# Patient Record
Sex: Male | Born: 1965 | Race: White | Hispanic: No | Smoking: Current every day smoker
Health system: Southern US, Community
[De-identification: ages and names within clinical notes are randomized; demographics above are authoritative.]

## PROBLEM LIST (undated history)

## (undated) DIAGNOSIS — G629 Polyneuropathy, unspecified: Secondary | ICD-10-CM

## (undated) DIAGNOSIS — M545 Low back pain, unspecified: Secondary | ICD-10-CM

## (undated) DIAGNOSIS — M1990 Unspecified osteoarthritis, unspecified site: Secondary | ICD-10-CM

## (undated) DIAGNOSIS — G8929 Other chronic pain: Secondary | ICD-10-CM

## (undated) DIAGNOSIS — E559 Vitamin D deficiency, unspecified: Secondary | ICD-10-CM

## (undated) DIAGNOSIS — E781 Pure hyperglyceridemia: Secondary | ICD-10-CM

## (undated) DIAGNOSIS — F519 Sleep disorder not due to a substance or known physiological condition, unspecified: Secondary | ICD-10-CM

## (undated) HISTORY — DX: Other chronic pain: G89.29

## (undated) HISTORY — DX: Vitamin D deficiency, unspecified: E55.9

## (undated) HISTORY — PX: INGUINAL HERNIA REPAIR: SUR1180

## (undated) HISTORY — PX: TONSILLECTOMY: SUR1361

## (undated) HISTORY — DX: Polyneuropathy, unspecified: G62.9

## (undated) HISTORY — DX: Sleep disorder not due to a substance or known physiological condition, unspecified: F51.9

## (undated) HISTORY — DX: Unspecified osteoarthritis, unspecified site: M19.90

## (undated) HISTORY — DX: Pure hyperglyceridemia: E78.1

## (undated) HISTORY — DX: Low back pain, unspecified: M54.50

---

## 2020-07-22 ENCOUNTER — Encounter: Payer: Self-pay | Admitting: *Deleted

## 2020-07-27 ENCOUNTER — Ambulatory Visit: Payer: 59 | Admitting: Neurology

## 2020-07-27 ENCOUNTER — Encounter: Payer: Self-pay | Admitting: Neurology

## 2020-07-27 DIAGNOSIS — M5441 Lumbago with sciatica, right side: Secondary | ICD-10-CM | POA: Diagnosis not present

## 2020-07-27 DIAGNOSIS — G8929 Other chronic pain: Secondary | ICD-10-CM | POA: Diagnosis not present

## 2020-07-27 MED ORDER — FAMOTIDINE 20 MG PO TABS: 20.0000 mg | ORAL_TABLET | Freq: Two times a day (BID) | ORAL | Status: AC

## 2020-07-27 MED ORDER — GABAPENTIN 300 MG PO CAPS: 300.0000 mg | ORAL_CAPSULE | Freq: Two times a day (BID) | ORAL | 3 refills | Status: AC

## 2020-07-27 NOTE — Progress Notes (Signed)
Reason for visit: Chronic low back pain, right leg pain, foot numbness  Referring physician: Dr. Barb Merino is a 55 y.o. male  History of present illness:  Jerry Fisher is a 55 year old right-handed white male with a history of chronic low back pain and right leg pain.  The patient is a daily smoker.  He has been sent for arterial Dopplers in the legs, this has not yet been done.  The patient reports that he has had some troubles with his back since he was in his 30s, the back pain has gradually worsened over time.  He reports pain across the low back and into the right hip and down the right leg to the foot.  On both feet he has some numbness and tingling and some discomfort.  The discomfort in the feet is worse at night.  He has difficulty sleeping in part because of the pain.  He reports that his legs feel somewhat weak.  He has trouble standing up straight as he feels better when he is slightly stooped.  The patient does have some chronic neck pain as well and some sharp pains in the upper arm on the left with occasional spread into the left shoulder and neck, associated with some headaches.  The headaches may occur 4 or 5 times a week.  The patient denies any significant balance changes, he does report some chronic constipation issues and difficulty voiding the bladder.  He claims that he had an MRI of the lumbar spine done at [facility] 7 or 8 years ago, he does not know the results of this.  He has a prior history of alcohol abuse but he is no longer drinking.  He is sent to this office for evaluation of a possible peripheral neuropathy.  He was given a prescription for Lyrica, but he is no longer taking the medication.  Past Medical History:  Diagnosis Date  . Chronic low back pain   . Hypertriglyceridemia   . Osteoarthritis    left shoulder, L spine  . Peripheral neuropathy   . Psychophysiologic sleep disorder   . Vitamin D deficiency       Family History   Problem Relation Age of Onset  . Cancer Mother   . Cancer Sister   . Alcohol abuse Half-Brother     Social history:  reports that he has been smoking. He has been smoking about 1.00 pack per day. He has never used smokeless tobacco. He reports previous alcohol use. He reports previous drug use. Drug: Marijuana.  Medications:  Prior to Admission medications   Medication Sig Start Date End Date Taking? Authorizing Provider  celecoxib (CELEBREX) 200 MG capsule Take 200 mg by mouth daily.   Yes [provider]  diclofenac Sodium (VOLTAREN) 1 % GEL Apply topically 4 (four) times daily.   Yes [provider]  famotidine (PEPCID) 20 MG tablet Take 20 mg by mouth 2 (two) times daily.   Yes [provider]  ICOSAPENT ETHYL PO Take 1 g by mouth in the morning and at bedtime.   Yes [provider]  pregabalin (LYRICA) 75 MG capsule Take 75 mg by mouth 2 (two) times daily.   Yes [provider]  Vitamin D, Ergocalciferol, (DRISDOL) 1.25 MG (50000 UNIT) CAPS capsule Take 50,000 Units by mouth every 7 (seven) days.   Yes [provider]     No Known Allergies  ROS:  Out of a complete 14 system review of  symptoms, the patient complains only of the following symptoms, and all other reviewed systems are negative.  Back pain Right leg pain Tingling in the feet Neck pain  Blood pressure 122/77, pulse 68, height 5\' 2"  (1.575 m), weight 158 lb 6.4 oz (71.8 kg).  Physical Exam  General: The patient is alert and cooperative at the time of the examination.  Eyes: Pupils are equal, round, and reactive to light. Discs are flat bilaterally.  Neck: The neck is supple, no carotid bruits are noted.  Respiratory: The respiratory examination is clear.  Cardiovascular: The cardiovascular examination reveals a regular rate and rhythm, no obvious murmurs or rubs are noted.  Neuromuscular: The patient is able to flex to about 90 degrees of the low back.   The patient has a slightly stooped posture, the right shoulder is depressed compared to the left.  Skin: Extremities are without significant edema.  Neurologic Exam  Mental status: The patient is alert and oriented x 3 at the time of the examination. The patient has apparent normal recent and remote memory, with an apparently normal attention span and concentration ability.  Cranial nerves: Facial symmetry is present. There is good sensation of the face to pinprick and soft touch bilaterally. The strength of the facial muscles and the muscles to head turning and shoulder shrug are normal bilaterally. Speech is well enunciated, no aphasia or dysarthria is noted. Extraocular movements are full. Visual fields are full. The tongue is midline, and the patient has symmetric elevation of the soft palate. No obvious hearing deficits are noted.  Motor: The motor testing reveals 5 over 5 strength of all 4 extremities. Good symmetric motor tone is noted throughout.  Sensory: Sensory testing is intact to pinprick, soft touch, vibration sensation, and position sense on all 4 extremities.  No clear stocking pattern pinprick sensory deficit was noted in the lower extremities.  No evidence of extinction is noted.  Coordination: Cerebellar testing reveals good finger-nose-finger and heel-to-shin bilaterally.  Gait and station: Gait is normal. Tandem gait is normal. Romberg is negative. No drift is seen.  The patient is able to walk on heels and the toes bilaterally.  Reflexes: Deep tendon reflexes are symmetric and normal bilaterally.  The ankle jerk reflexes are well-maintained bilaterally.  Toes are downgoing bilaterally.   Assessment/Plan:  1.  Chronic low back pain, right-sided sciatica  2.  Paresthesias, both feet  The patient does not have clear evidence of a peripheral neuropathy on clinical examination today.  His symptoms in the legs may be related to a lumbar spine issue.  The patient will be  set up for nerve conduction studies on both legs and EMG on the right leg.  We may consider MRI of the lumbar spine in the future.  He will be given a prescription for gabapentin to take 300 mg twice daily, he will call for any dose adjustments.  He will otherwise follow-up in 4 months.  MD 07/27/2020 8:48 AM  [facility] [address] [place], [place]  Phone [phone] Fax [phone]

## 2020-07-27 NOTE — Patient Instructions (Signed)
We will start gabapentin for the low back pain.  Neurontin (gabapentin) may result in drowsiness, ankle swelling, gait instability, or possibly dizziness. Please contact our office if significant side effects occur with this medication.  

## 2020-08-10 ENCOUNTER — Encounter: Payer: 59 | Admitting: Neurology

## 2020-08-24 ENCOUNTER — Encounter: Payer: Self-pay | Admitting: Neurology

## 2020-08-24 ENCOUNTER — Ambulatory Visit (INDEPENDENT_AMBULATORY_CARE_PROVIDER_SITE_OTHER): Payer: 59 | Admitting: Neurology

## 2020-08-24 ENCOUNTER — Ambulatory Visit: Payer: 59 | Admitting: Neurology

## 2020-08-24 DIAGNOSIS — M5441 Lumbago with sciatica, right side: Secondary | ICD-10-CM | POA: Diagnosis not present

## 2020-08-24 DIAGNOSIS — G8929 Other chronic pain: Secondary | ICD-10-CM

## 2020-08-24 NOTE — Progress Notes (Addendum)
The patient comes in for EMG and nerve conduction study evaluation.  Nerve conductions were normal, EMG of the right leg was normal.  The source of the leg discomfort, right greater than left is not delineated.  We will check MRI of the lumbar spine     MNC    Nerve / Sites Muscle Latency Ref. Amplitude Ref. Rel Amp Segments Distance Velocity Ref. Area    ms ms mV mV %  cm m/s m/s mVms  L Peroneal - EDB     Ankle EDB 3.3 ?6.5 7.3 ?2.0 100 Ankle - EDB 9   19.1     Fib head EDB 8.8  6.4  86.9 Fib head - Ankle 27 49 ?44 17.3     Pop fossa EDB 11.0  6.3  98.6 Pop fossa - Fib head 10 44 ?44 17.5         Pop fossa - Ankle      R Peroneal - EDB     Ankle EDB 3.8 ?6.5 7.5 ?2.0 100 Ankle - EDB 9   18.0     Fib head EDB 9.4  7.0  92.9 Fib head - Ankle 27 48 ?44 17.5     Pop fossa EDB 11.5  6.6  94.1 Pop fossa - Fib head 10 48 ?44 17.0         Pop fossa - Ankle      L Tibial - AH     Ankle AH 3.2 ?5.8 4.3 ?4.0 100 Ankle - AH 9   8.6     Pop fossa AH 11.9  3.6  82.5 Pop fossa - Ankle 38 43 ?41 7.5  R Tibial - AH     Ankle AH 2.9 ?5.8 5.3 ?4.0 100 Ankle - AH 9   7.0     Pop fossa AH 11.0  4.1  77.9 Pop fossa - Ankle 37 46 ?41 6.8             SNC    Nerve / Sites Rec. Site Peak Lat Ref.  Amp Ref. Segments Distance    ms ms V V  cm  L Sural - Ankle (Calf)     Calf Ankle 2.9 ?4.4 9 ?6 Calf - Ankle 14  R Sural - Ankle (Calf)     Calf Ankle 2.6 ?4.4 8 ?6 Calf - Ankle 14  L Superficial peroneal - Ankle     Lat leg Ankle 3.3 ?4.4 6 ?6 Lat leg - Ankle 14  R Superficial peroneal - Ankle     Lat leg Ankle 3.4 ?4.4 5 ?6 Lat leg - Ankle 14             F  Wave    Nerve F Lat Ref.   ms ms  L Tibial - AH 49.5 ?56.0  R Tibial - AH 48.8 ?56.0

## 2020-08-24 NOTE — Progress Notes (Signed)
Please refer to EMG and nerve conduction procedure note.  

## 2020-08-24 NOTE — Procedures (Signed)
     HISTORY:  Jerry Fisher is a 55 year old gentleman with a chronic history of low back pain with right greater than left lower extremity discomfort and numbness in both feet.  The patient is having discomfort that is worsening over time, he is not sleeping well.  He is in physical therapy without much benefit.  He is being evaluated for possible radiculopathy.  NERVE CONDUCTION STUDIES:  Nerve conduction studies were performed on both lower extremities. The distal motor latencies and motor amplitudes for the peroneal and posterior tibial nerves were within normal limits. The nerve conduction velocities for these nerves were also normal. The sensory latencies for the peroneal and sural nerves were within normal limits. The F wave latencies for the posterior tibial nerves were within normal limits.   EMG STUDIES:  EMG study was performed on the right lower extremity:  The tibialis anterior muscle reveals 2 to 4K motor units with full recruitment. No fibrillations or positive waves were seen. The peroneus tertius muscle reveals 2 to 4K motor units with full recruitment. No fibrillations or positive waves were seen. The medial gastrocnemius muscle reveals 1 to 3K motor units with full recruitment. No fibrillations or positive waves were seen. The vastus lateralis muscle reveals 2 to 4K motor units with full recruitment. No fibrillations or positive waves were seen. The iliopsoas muscle reveals 2 to 4K motor units with full recruitment. No fibrillations or positive waves were seen. The biceps femoris muscle (long head) reveals 2 to 4K motor units with full recruitment. No fibrillations or positive waves were seen. The lumbosacral paraspinal muscles were tested at 3 levels, and revealed no abnormalities of insertional activity at all 3 levels tested. There was fair relaxation.   IMPRESSION:  Nerve conduction studies done on both lower extremities were within normal limits, no evidence of  peripheral neuropathy was seen.  EMG evaluation of the right lower extremity was unremarkable without evidence of an overlying lumbar radiculopathy.  Marlan Palau MD 08/24/2020 3:42 PM  [facility] [address] [place], [place] [address]  Phone [phone] Fax [phone]

## 2020-08-25 ENCOUNTER — Telehealth: Payer: Self-pay | Admitting: Neurology

## 2020-08-25 NOTE — Telephone Encounter (Signed)
[employer] order sent to [facility]. They will obtain the auth and reach out to the patient to schedule.  

## 2020-08-28 ENCOUNTER — Other Ambulatory Visit: Payer: Self-pay | Admitting: Neurology

## 2020-08-28 ENCOUNTER — Ambulatory Visit
Admission: RE | Admit: 2020-08-28 | Discharge: 2020-08-28 | Disposition: A | Discharge status: Home / Self Care | Payer: 59 | Source: Ambulatory Visit | Attending: Neurology | Admitting: Neurology

## 2020-08-28 ENCOUNTER — Other Ambulatory Visit: Payer: Self-pay

## 2020-08-28 DIAGNOSIS — Z77018 Contact with and (suspected) exposure to other hazardous metals: Secondary | ICD-10-CM

## 2020-08-28 DIAGNOSIS — G8929 Other chronic pain: Secondary | ICD-10-CM

## 2020-08-28 DIAGNOSIS — M5441 Lumbago with sciatica, right side: Secondary | ICD-10-CM

## 2020-08-28 IMAGING — MR MR LUMBAR SPINE W/O CM
4 of 5 series · 26 of 48 positions shown · non-contrast
Comparison: CT abdomen/pelvis from [DATE].

CLINICAL DATA: Lumbar radiculopathy.  Right leg weakness.

EXAM:
MRI LUMBAR SPINE WITHOUT CONTRAST
TECHNIQUE: Multiplanar, multisequence MR imaging of the lumbar spine was
performed. No intravenous contrast was administered.

[Series 3: T2 · sagittal · 4.0mm · 1.09mm/px · 6 of 20 slices shown (1 of 2)]
[im 1/20]
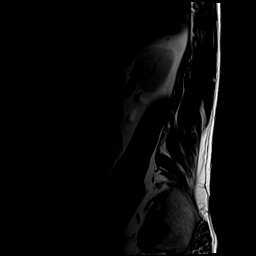
[im 4/20]
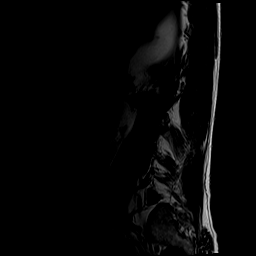
[im 8/20]
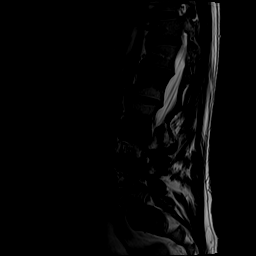
[im 12/20]
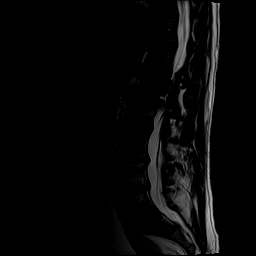
[im 16/20]
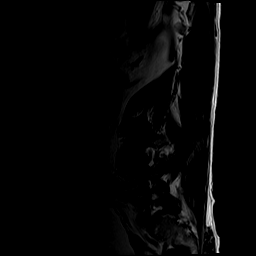
[im 20/20]
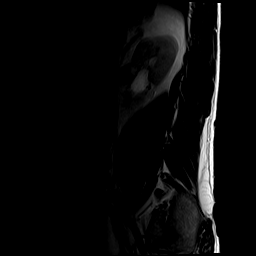

[Series 5: T1 · sagittal · 4.0mm · 1.09mm/px · 6 of 20 slices shown (1 of 2)]
[im 1/20]
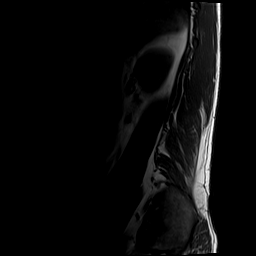
[im 4/20]
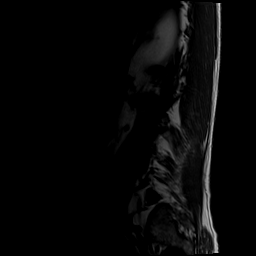
[im 8/20]
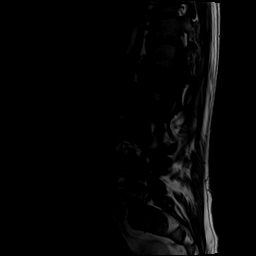
[im 12/20]
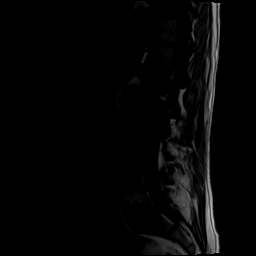
[im 16/20]
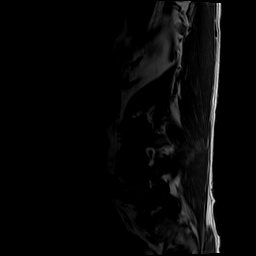
[im 20/20]
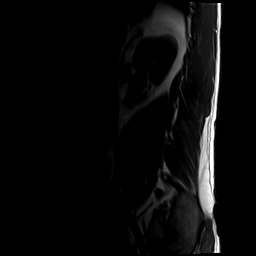

[Series 6: T2 · axial · 4.0mm · 0.39mm/px · z∈[-115,+142]mm · 9 of 52 slices shown (2 of 2)]
[im 1/52]
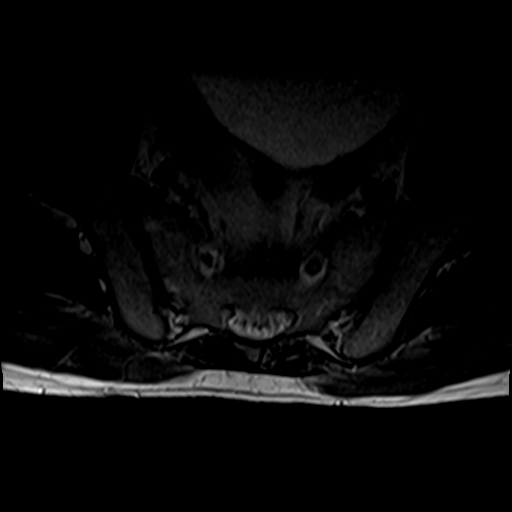
[im 8/52]
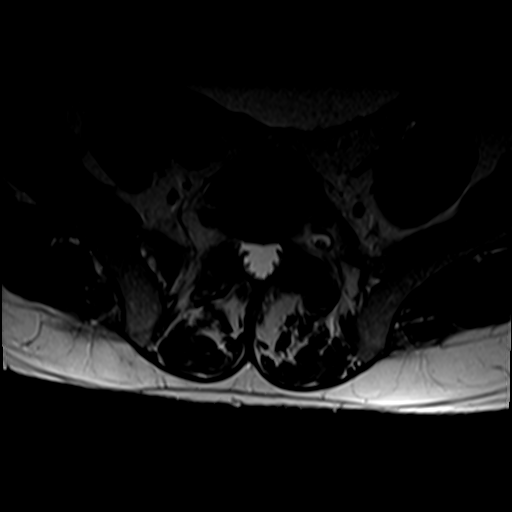
[im 15/52]
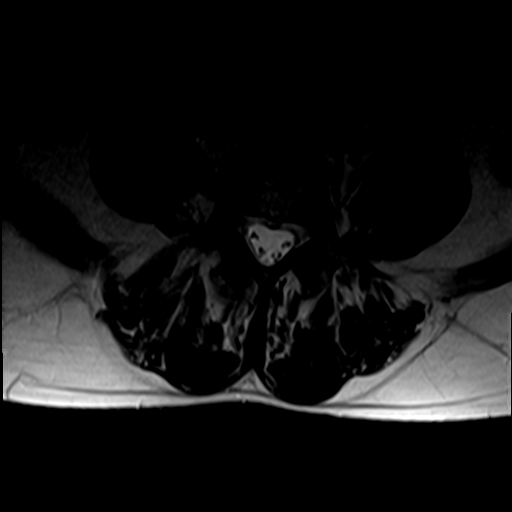
[im 22/52]
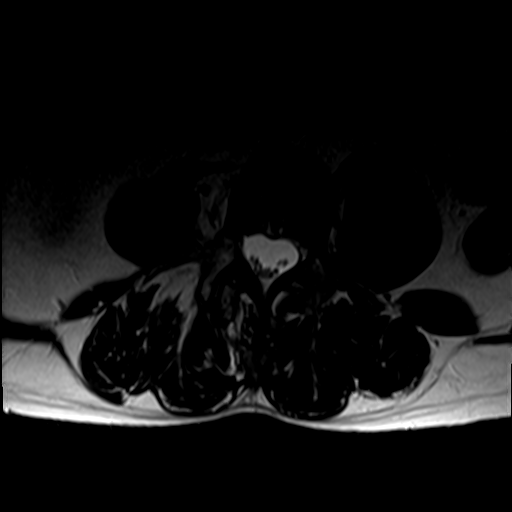
[im 26/52]
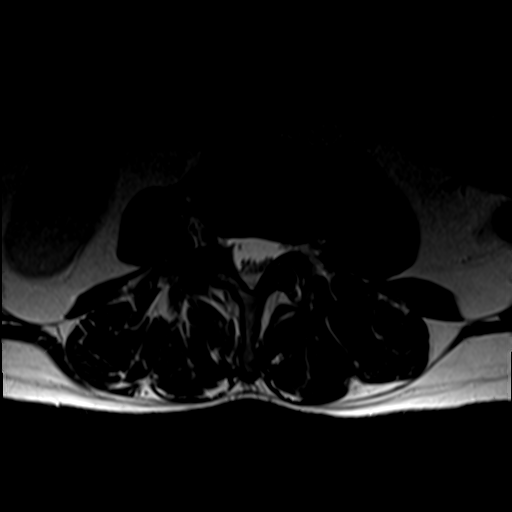
[im 30/52]
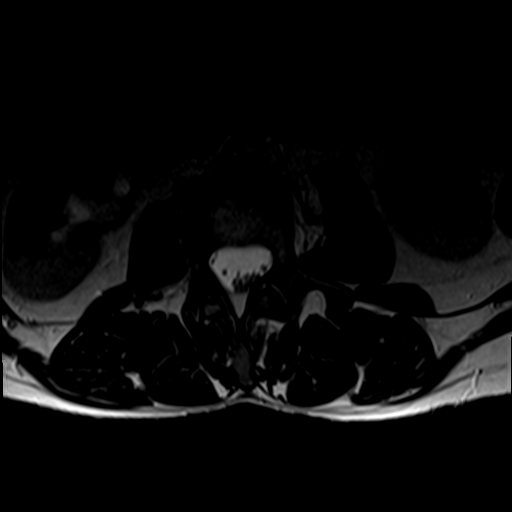
[im 37/52]
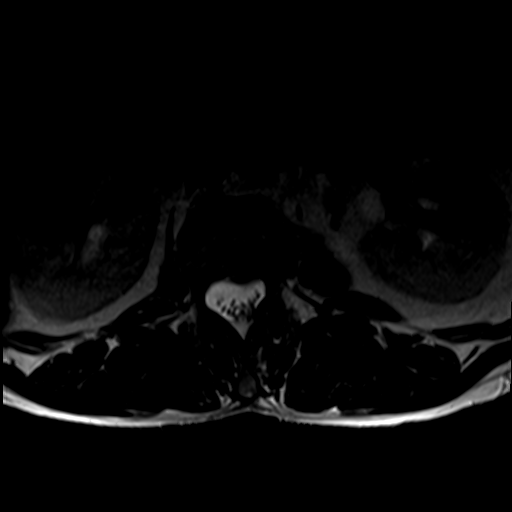
[im 44/52]
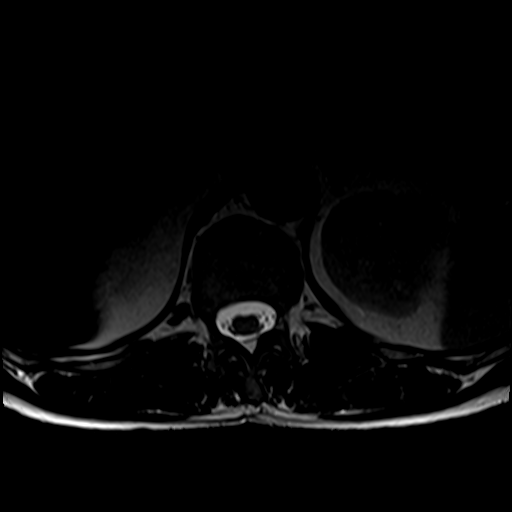
[im 52/52]
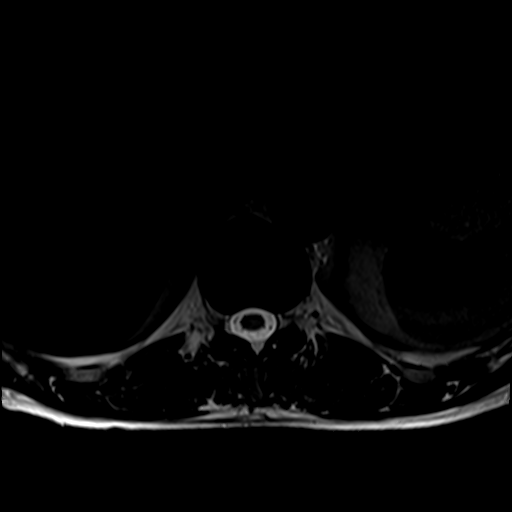

[Series 7: T1 · axial · 4.0mm · 0.39mm/px · z∈[-115,+104]mm · 5 of 52 slices shown (2 of 2)]
[im 1/52]
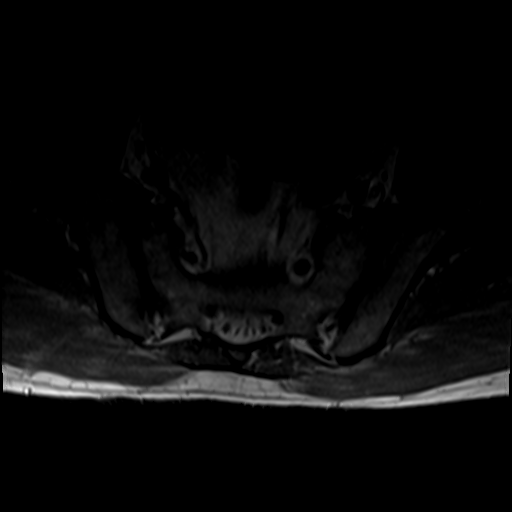
[im 8/52]
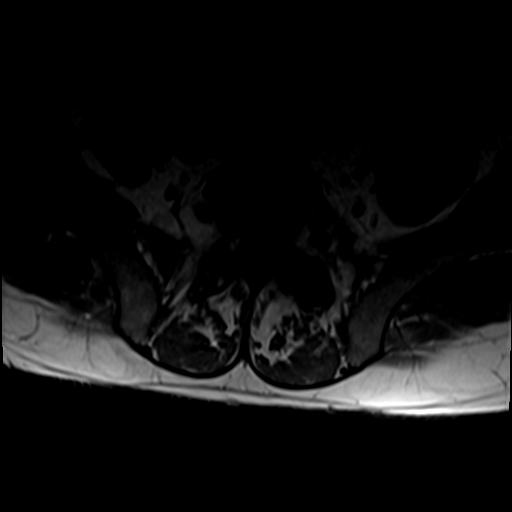
[im 15/52]
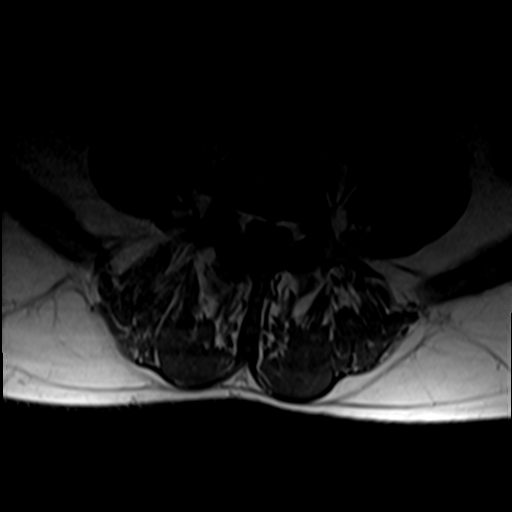
[im 26/52]
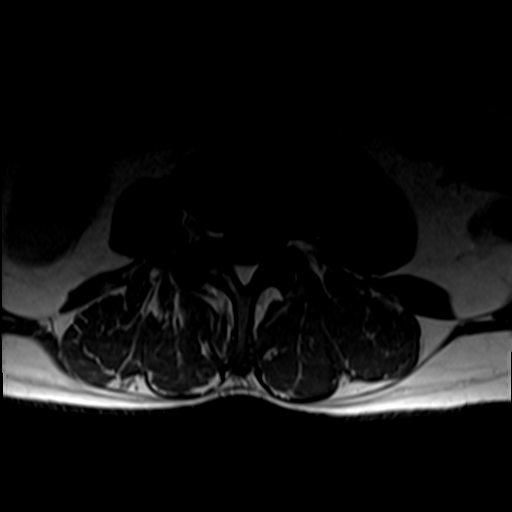
[im 44/52]
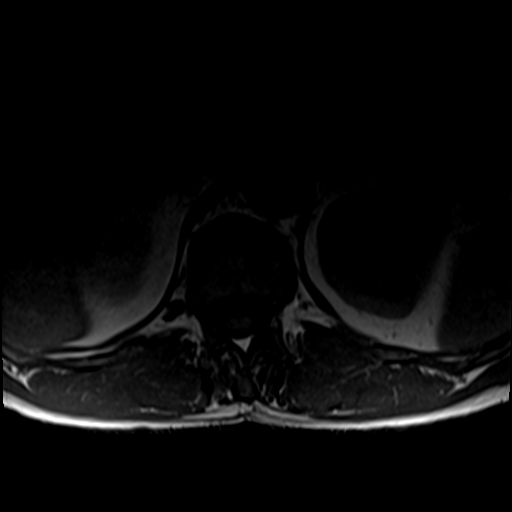

[26 of 48 positions shown; findings below may reference images not displayed]

FINDINGS: Segmentation: Standard segmentation is imaged. The inferior-most
fully formed intervertebral disc is labeled L5-S1.

Alignment: Dextrocurvature of the upper spine with levocurvature of
the lower spine.

Vertebrae: Degenerative/discogenic signal changes about the L1-L2,
L3-L4 and L4-L5 discs. No specific evidence of acute fracture or
discitis/osteomyelitis.

Conus medullaris and cauda equina: Conus extends to the T12-L1
level. Conus appears normal.

Paraspinal and other soft tissues: Partially imaged distended
bladder with somewhat trabeculated walls.

Disc levels:

T11-T12: Small right subarticular disc protrusion without
significant canal or foraminal stenosis.

T12-L1: Small broad disc bulge with left subarticular disc
protrusion. No significant canal or foraminal stenosis.

L1-L2: Left eccentric disc desiccation and height loss. Left
eccentric disc bulging. Mild left foraminal stenosis. There is left
far lateral/extraforaminal disc.

L2-L3: Disc bulging and right greater than left facet hypertrophy.
Resulting moderate right foraminal stenosis. No significant left
foraminal or canal stenosis.

L3-L4: Right eccentric disc desiccation and height loss. Bulky
right-sided facet hypertrophy. Resulting moderate to severe right
foraminal stenosis. No significant canal or left foraminal stenosis.
Moderate to severe right subarticular recess stenosis with suspected
impingement of the descending right L4 nerve roots.

L4-L5: Disc desiccation height loss. Broad disc bulge and bilateral
facet hypertrophy. No significant canal stenosis. Moderate bilateral
foraminal stenosis. Mild left subarticular recess stenosis.

L5-S1: Mild disc bulging and bilateral facet hypertrophy. No
significant canal or foraminal stenosis.
IMPRESSION: 1. Moderate to severe right subarticular recess stenosis at L3-L4
with suspected impingement of the descending right L4 nerve roots.
Moderate to severe right foraminal stenosis at this level.
2. Moderate foraminal stenosis on the right at L3-L4 and bilaterally
at L4-L5. Mild left foraminal stenosis at L1-L2.
3. Partially imaged distended bladder with somewhat trabeculated
walls, which can be seen with bladder outlet obstruction.
4. Dextrocurvature of the upper lumbar spine and levocurvature of
the lower lumbar spine.

## 2020-08-28 IMAGING — CR DG ORBITS FOR FOREIGN BODY
2 series · 2 of 2 positions shown · non-contrast
Comparison: None.

CLINICAL DATA: Metal working/exposure; clearance prior to MRI

EXAM:
ORBITS FOR FOREIGN BODY - 2 VIEW

[w orbit pa (1 of 2)]
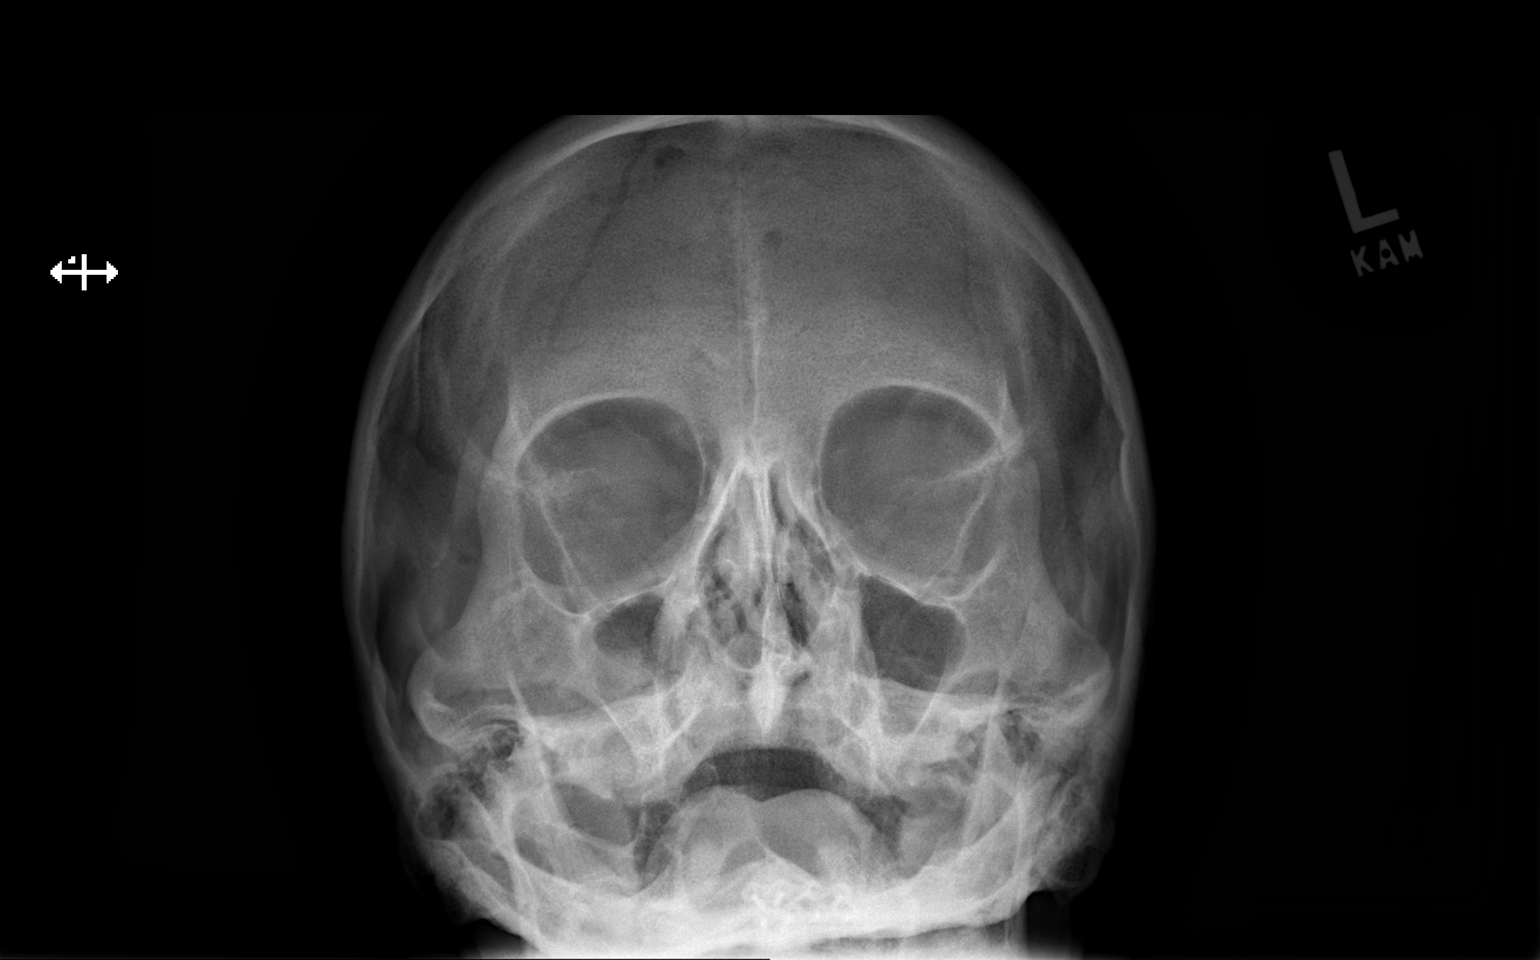

[w orbit pa (2 of 2)]
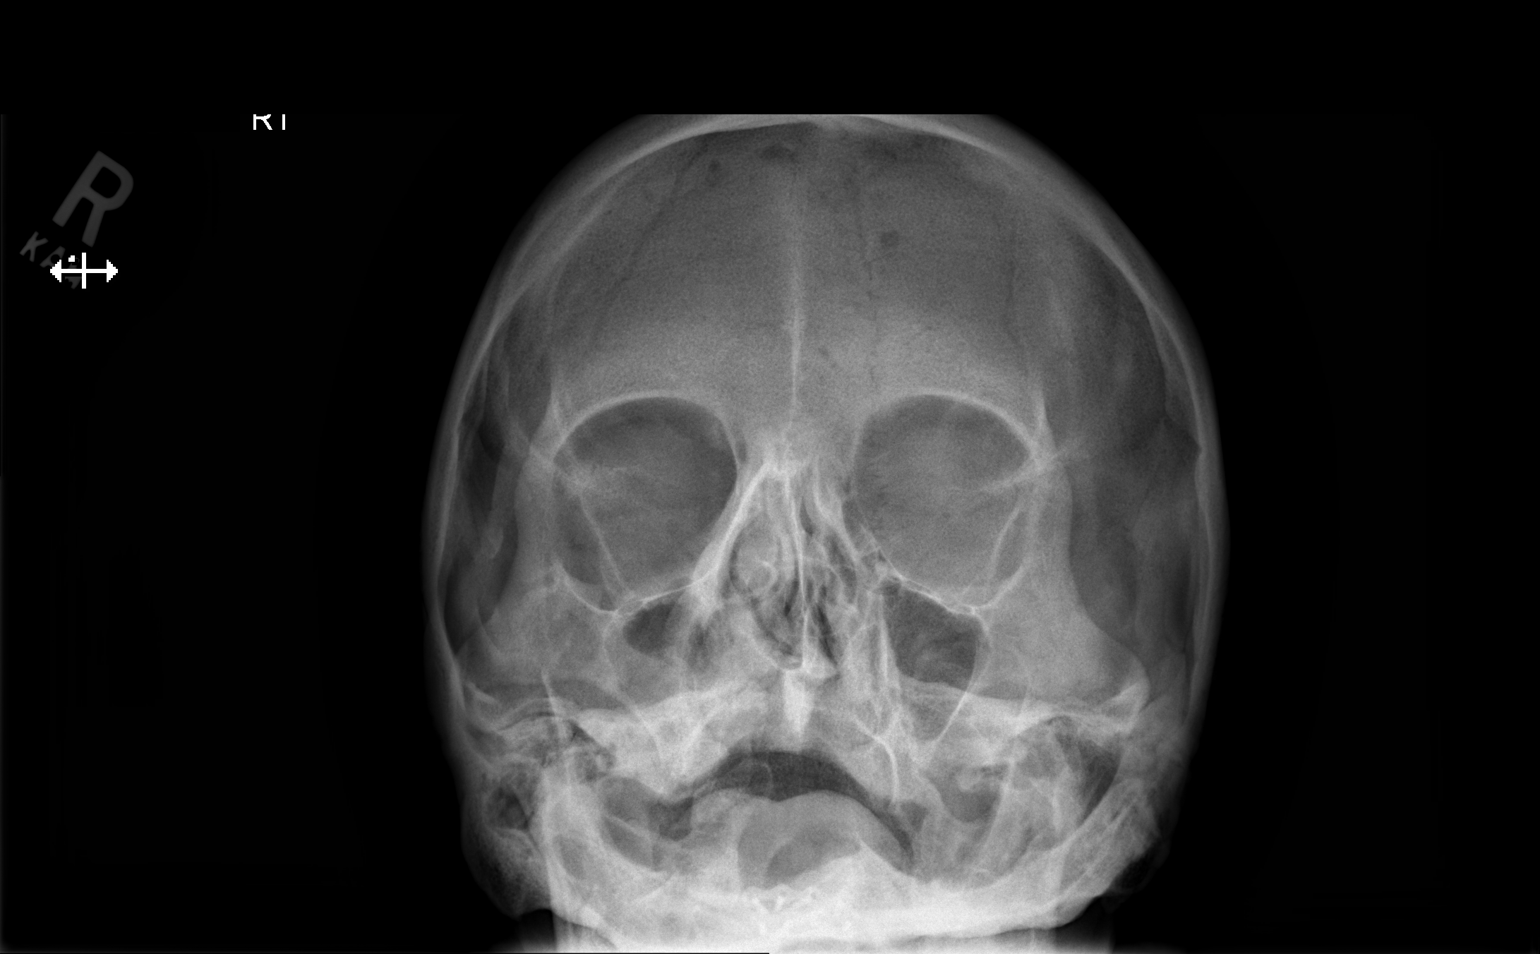

[2 of 2 positions shown; findings below may reference images not displayed]

FINDINGS: There is no evidence of metallic foreign body within the orbits. No
significant bone abnormality identified.
IMPRESSION: No evidence of metallic foreign body within the orbits.

## 2020-08-30 ENCOUNTER — Telehealth: Payer: Self-pay | Admitting: Neurology

## 2020-08-30 DIAGNOSIS — M5431 Sciatica, right side: Secondary | ICD-10-CM

## 2020-08-30 NOTE — Telephone Encounter (Signed)
I called the patient.  MRI of the low back does show potential for right L5 nerve root impingement.  This would correlate with his sciatica down the right leg.  I will try an epidural steroid injection, if this does not help, we may consider a neurosurgery referral.     MRI lumbar 08/28/20:  IMPRESSION: This MRI of the lumbar spine without contrast shows the following: 1.   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 2.   Scoliosis that is convex to the right in the upper lumbar spine and convex to the left in the lower lumbar spine. 3.   At L2-L3, there is severe loss of disc height, more to the left and other degenerative changes more to the left causing moderate left foraminal narrowing and moderate left lateral recess stenosis.  There does not appear to be nerve root compression. 4.   At L4-L5, there is disc bulging, facet hypertrophy, endplate spurring and endplate edema.  These cause moderately severe right foraminal narrowing and moderate right lateral recess stenosis.  There is potential for right L4 nerve root compression. 5.   At L5-L6(S1), there is severe loss of disc height and other degenerative changes causing mild to moderate bilateral foraminal narrowing and moderate left lateral recess stenosis and mild to moderate right lateral recess stenosis.  There does not appear to be nerve root compression. 6.   There are milder degenerative changes at the other lumbosacral levels as detailed above that does not lead to significant foraminal narrowing, lateral recess stenosis or spinal stenosis.

## 2020-08-31 NOTE — Telephone Encounter (Signed)
Pt called wanting to know if he is still needing to go to PT. Please advise.

## 2020-09-01 ENCOUNTER — Telehealth: Payer: Self-pay | Admitting: Neurology

## 2020-09-01 ENCOUNTER — Other Ambulatory Visit: Payer: Self-pay | Admitting: Neurology

## 2020-09-01 DIAGNOSIS — N320 Bladder-neck obstruction: Secondary | ICD-10-CM

## 2020-09-01 DIAGNOSIS — M5431 Sciatica, right side: Secondary | ICD-10-CM

## 2020-09-01 NOTE — Telephone Encounter (Signed)
I called the patient.  MRI of the low back shows potential for impingement of the right L4 nerve root.  Would recommend epidural steroid injection to see if this benefits the right-sided leg pain.  Also, the patient has reported difficulty voiding the bladder, there is evidence of possible bladder obstruction, if you would like a urology referral he is to let me know.   MRI lumbar 09/01/20:  IMPRESSION: 1. 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 Moderate to severe right foraminal stenosis at this level. 2. Moderate foraminal stenosis on the right at L3-L4 and bilaterally at L4-L5. Mild left foraminal stenosis at L1-L2. 3. Partially imaged distended bladder with somewhat trabeculated walls, which can be seen with bladder outlet obstruction. 4. Dextrocurvature of the upper lumbar spine and levocurvature of the lower lumbar spine.

## 2020-09-01 NOTE — Telephone Encounter (Signed)
Received this note from Dr. Anne Hahn :"Please call the patient tomorrow and let him know that if he has been set up for physical therapy, he should continue with this, the epidural may help as well with improving symptoms.."  I called patient.  I discussed this with him.  He has a physical therapy appointment set up for Friday.  He understands that radiology will be calling him to set up the epidural steroid injection.  Patient verbalized understanding and appreciation.

## 2020-09-01 NOTE — Progress Notes (Signed)
The patient called back, he is amenable to having a urology referral, I will get this set up.  He wishes to go ahead and get the epidural steroid injection, possibility of a right L4 nerve root impingement.  I have placed the order for this.

## 2020-09-02 ENCOUNTER — Other Ambulatory Visit: Payer: Self-pay | Admitting: Chiropractic Medicine

## 2020-09-02 ENCOUNTER — Telehealth: Payer: Self-pay | Admitting: Neurology

## 2020-09-02 ENCOUNTER — Other Ambulatory Visit: Payer: Self-pay | Admitting: Neurology

## 2020-09-02 DIAGNOSIS — M5416 Radiculopathy, lumbar region: Secondary | ICD-10-CM

## 2020-09-02 NOTE — Telephone Encounter (Addendum)
Faxed Urology referral to [facility]. Requested first available. Phone [phone], Fax [phone].   Faxed Interventional Radiology referral to [facility]. Fax: [phone].

## 2020-09-16 ENCOUNTER — Telehealth: Payer: Self-pay | Admitting: Neurology

## 2020-09-16 NOTE — Telephone Encounter (Signed)
Called patient and he said if his insurance denies the procedure he wants to be referred to a pain management center.  Patient is calling to find out if he will be able to get the procedure and is going to call back.  Patient denied further questions, verbalized understanding and expressed appreciation for the phone call.

## 2020-09-16 NOTE — Telephone Encounter (Signed)
Pt called needing to speak to the Provider or RN regarding the epidural that he is to get for his back. Pt would like to know if this will be covered by his insurance. He states that if it isnt he is needing some other kind of option because he is in a lot of pain. Please advise.

## 2020-09-16 NOTE — Telephone Encounter (Signed)
Patient called back, stated that he will have to have a peer to peer with his insurance company for him to be approved for the epidural injection.    Patient is also asking for referral to pain management.    He is asking for Dr. Anne Hahn to call him.

## 2020-09-17 NOTE — Telephone Encounter (Signed)
LVM for cathy @ [facility] regarding PreAuth for injection.  Will need denial information to receive the Peer to Peer information for appeal.

## 2020-09-21 NOTE — Telephone Encounter (Signed)
[place] returned call from [facility].  Said she would talk to the Contractor and get the denial letter so a peer to peer can be done.

## 2020-09-21 NOTE — Telephone Encounter (Signed)
Pt called wanting to speak to the Provider regarding the Epidural. Pt states that it was questioned if he had taken PT and pt states he has already had PT. Please advise.

## 2020-09-21 NOTE — Telephone Encounter (Signed)
Pt returned call and was informed. Pt was very appreciative.

## 2020-09-21 NOTE — Telephone Encounter (Signed)
LVM for Jerry Fisher at [facility] to return call

## 2020-09-21 NOTE — Telephone Encounter (Signed)
LVM for patient to return call.    If patient calls back, please let him know that Dr. Anne Hahn will do a peer to peer.  We have to get the denial letter back with information for him.  Thanks

## 2020-09-24 NOTE — Telephone Encounter (Signed)
Denial information letter received from [facility].  Given to Dr Anne Hahn for peer to peer review.

## 2020-09-25 ENCOUNTER — Encounter: Payer: Self-pay | Admitting: Neurology

## 2020-09-25 ENCOUNTER — Telehealth: Payer: Self-pay | Admitting: Neurology

## 2020-09-25 NOTE — Telephone Encounter (Signed)
I called the patient.  The epidural injection was denied, the time for initiation of peer to peer review has lapsed, I will have to make a formal appeal.  The insurance indicates that the patient will require physical therapy before the epidural injection is allowed, but he has already had physical therapy, he was getting physical therapy when he was initially seen in March 2022.  MRI of the lumbar spine has shown evidence of impingement of the right L4 nerve root, the patient is having sciatica pain down the right leg, MRI evidence and clinical evidence support 1 another.   I will dictate a letter of appeal in this regard.

## 2020-10-06 NOTE — Telephone Encounter (Signed)
Appeal faxed for epidural injection Case # [id number] To [employer]  OK transmission received

## 2020-10-08 ENCOUNTER — Telehealth: Payer: Self-pay | Admitting: Neurology

## 2020-10-08 NOTE — Telephone Encounter (Signed)
Pt called, following up on the peer to peer with insurance company.  Would like a call from the nurse.

## 2020-10-08 NOTE — Telephone Encounter (Signed)
Returned and LVM for patient to return call.      Today:  Received call from patient following up on peer to peer    Per Dr. Anne Hahn 09/25/20 I called the patient.  The epidural injection was denied, the time for initiation of peer to peer review has lapsed, I will have to make a formal appeal.   The insurance indicates that the patient will require physical therapy before the epidural injection is allowed, but he has already had physical therapy, he was getting physical therapy when he was initially seen in March 2022.   MRI of the lumbar spine has shown evidence of impingement of the right L4 nerve root, the patient is having sciatica pain down the right leg, MRI evidence and clinical evidence support 1 another.     I will dictate a letter of appeal in this regard.

## 2020-10-12 NOTE — Telephone Encounter (Signed)
Patient returned call.  Went over Dr  Anne Hahn documentation regarding peer to peer review as well as the formal review that has been initiated by Dr. Anne Hahn.  Patient will be updated as soon as we learn more.  Patient denied further questions, verbalized understanding and expressed appreciation for the phone call.

## 2020-10-12 NOTE — Telephone Encounter (Signed)
Patient sent message for follow up on epidural injection. LVM for patient to return call.

## 2021-01-07 ENCOUNTER — Other Ambulatory Visit: Payer: Self-pay | Admitting: Emergency Medicine

## 2021-01-07 DIAGNOSIS — R519 Headache, unspecified: Secondary | ICD-10-CM

## 2021-01-11 ENCOUNTER — Other Ambulatory Visit: Payer: Self-pay | Admitting: Emergency Medicine

## 2021-01-11 ENCOUNTER — Other Ambulatory Visit: Payer: 59

## 2021-01-11 ENCOUNTER — Ambulatory Visit
Admission: RE | Admit: 2021-01-11 | Discharge: 2021-01-11 | Disposition: A | Discharge status: Home / Self Care | Payer: 59 | Source: Ambulatory Visit | Attending: Emergency Medicine | Admitting: Emergency Medicine

## 2021-01-11 ENCOUNTER — Other Ambulatory Visit: Payer: Self-pay

## 2021-01-11 ENCOUNTER — Ambulatory Visit
Admission: RE | Admit: 2021-01-11 | Discharge: 2021-01-11 | Disposition: A | Discharge status: Home / Self Care | Payer: Worker's Compensation | Source: Ambulatory Visit | Attending: Emergency Medicine | Admitting: Emergency Medicine

## 2021-01-11 DIAGNOSIS — R519 Headache, unspecified: Secondary | ICD-10-CM

## 2021-01-11 IMAGING — MR MR CERVICAL SPINE W/O CM
4 of 5 series · 27 of 48 positions shown · non-contrast
Comparison: None.

CLINICAL DATA: Neck pain and headaches

EXAM:
MRI CERVICAL SPINE WITHOUT CONTRAST
TECHNIQUE: Multiplanar, multisequence MR imaging of the cervical spine was
performed. No intravenous contrast was administered.

[Series 5: T2 · sagittal · 3.0mm · 0.55mm/px · 6 of 15 slices shown (1 of 2)]
[im 1/15]
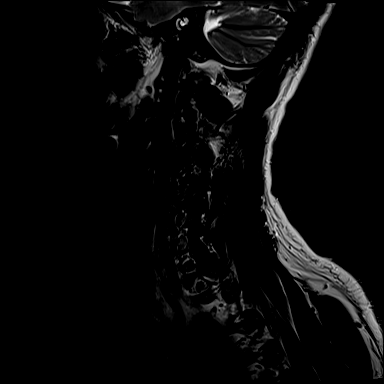
[im 3/15]
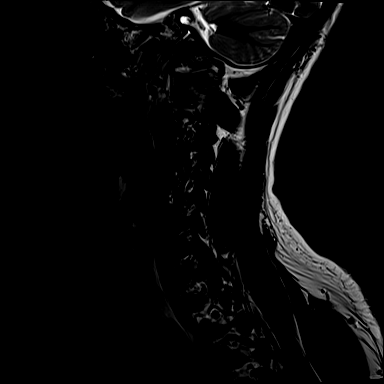
[im 6/15]
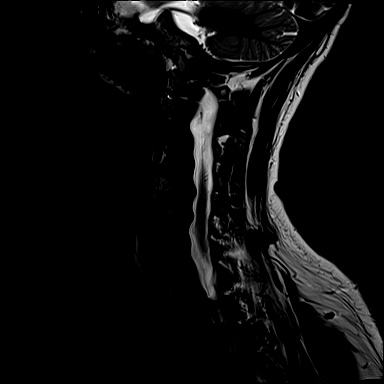
[im 9/15]
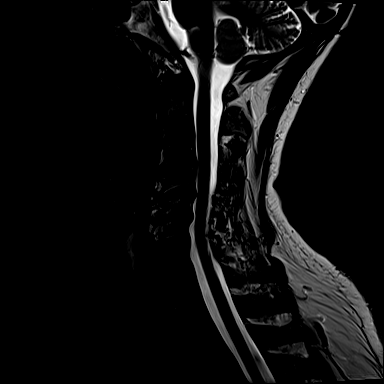
[im 12/15]
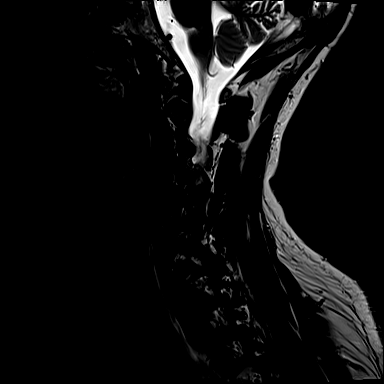
[im 15/15]
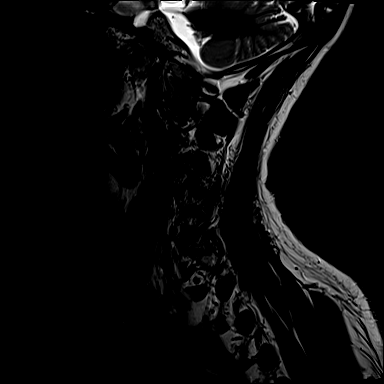

[Series 6: T1 · sagittal · 3.0mm · 0.66mm/px · 7 of 15 slices shown]
[im 1/15]
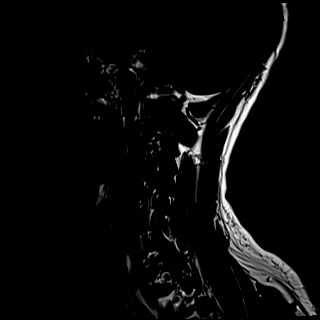
[im 3/15]
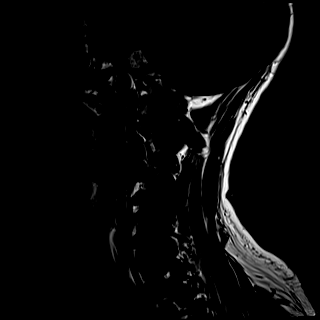
[im 5/15]
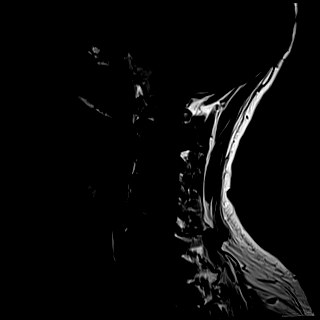
[im 8/15]
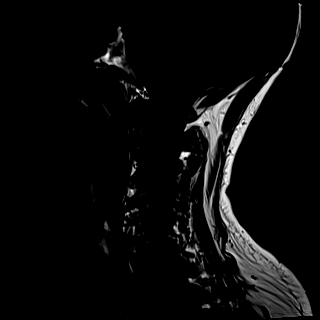
[im 10/15]
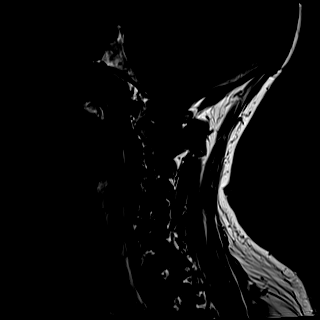
[im 12/15]
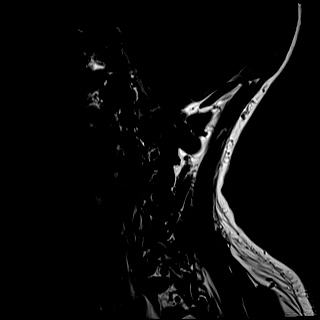
[im 15/15]
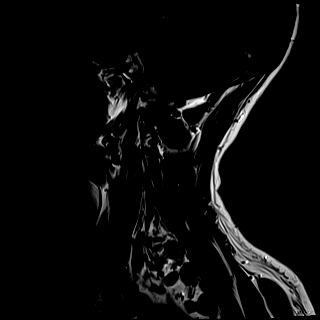

[Series 7: STIR · sagittal · 3.0mm · 0.33mm/px · 6 of 15 slices shown]
[im 1/15]
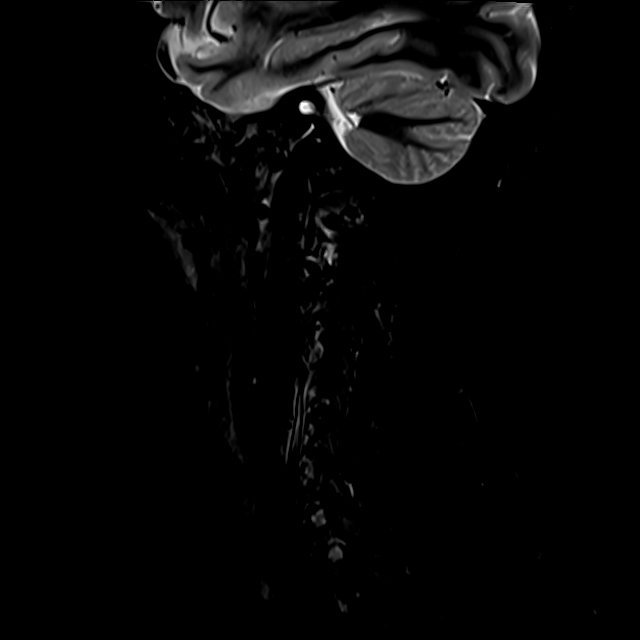
[im 3/15]
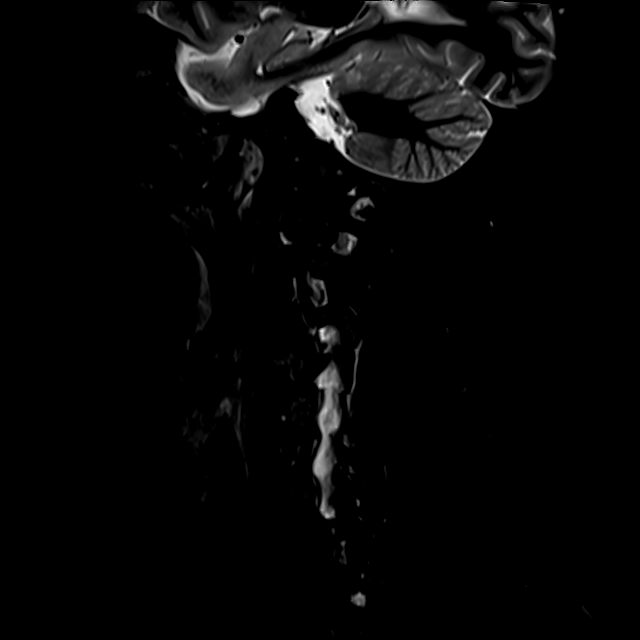
[im 5/15]
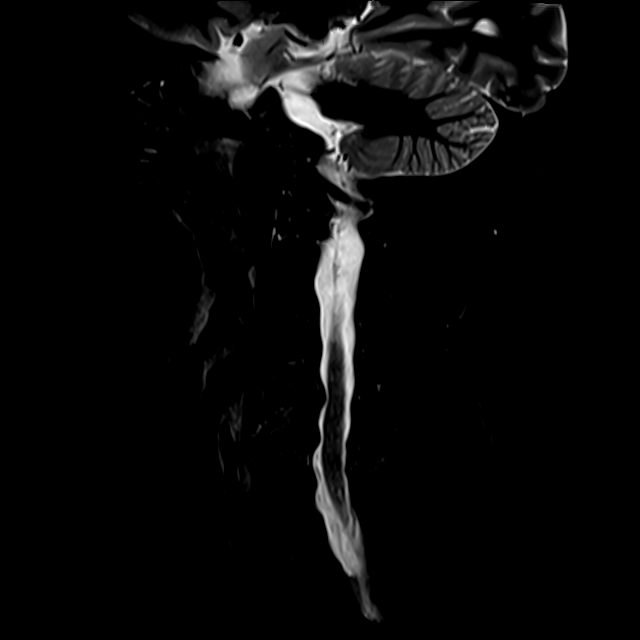
[im 8/15]
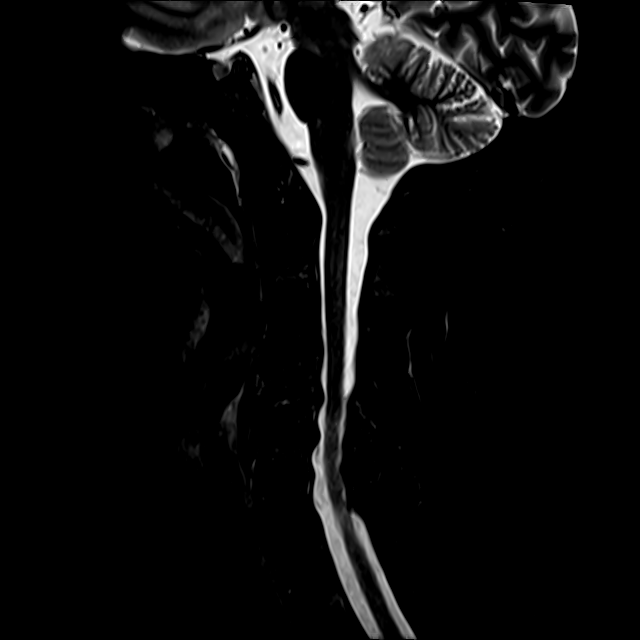
[im 10/15]
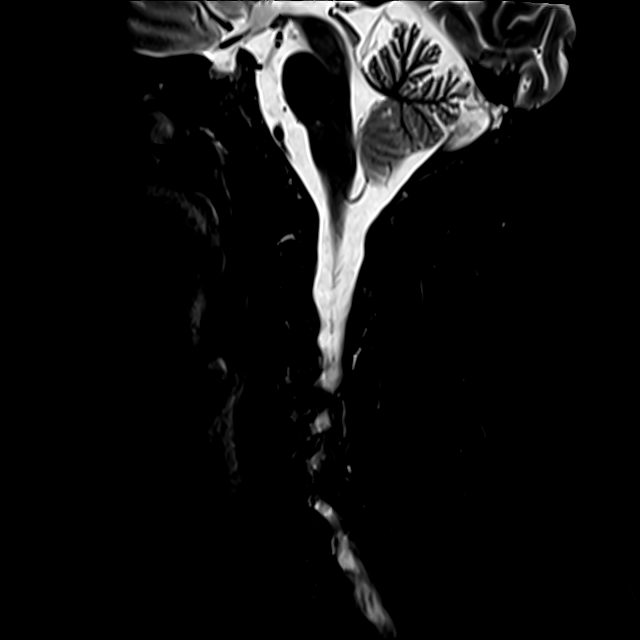
[im 12/15]
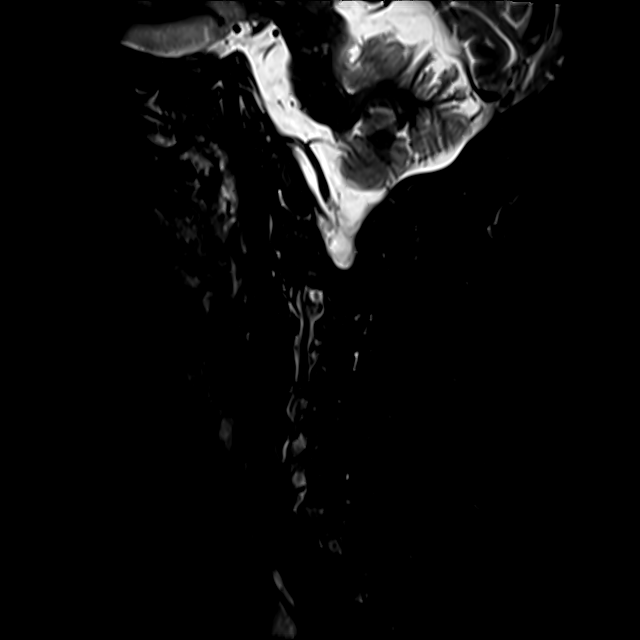

[Series 8: T2 · axial · 3.0mm · 0.50mm/px · z∈[-5,+90]mm · 8 of 30 slices shown (2 of 2)]
[im 1/30]
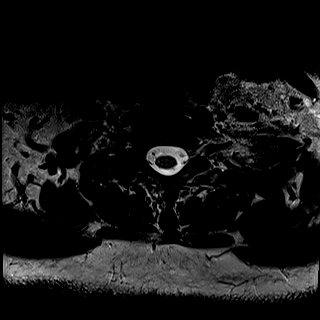
[im 5/30]
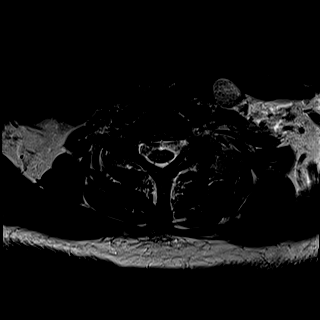
[im 9/30]
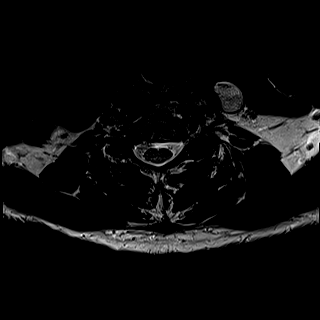
[im 14/30]
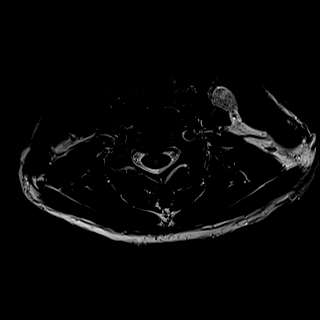
[im 16/30]
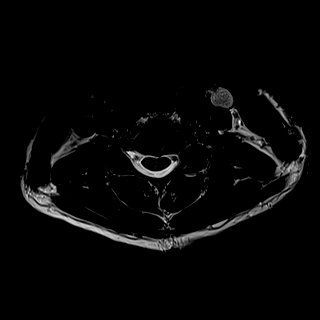
[im 21/30]
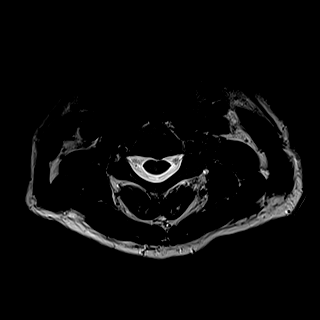
[im 25/30]
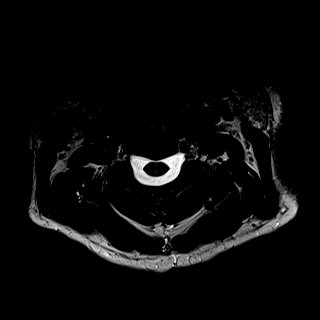
[im 30/30]
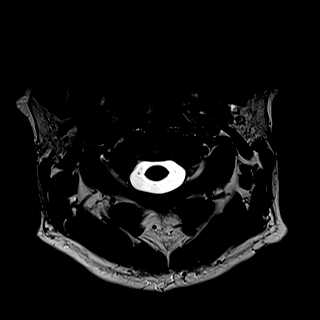

[27 of 48 positions shown; findings below may reference images not displayed]

FINDINGS: Alignment: Mild degenerative listhesis. For example, retrolisthesis
at C4-C5 and C5-C6.

Vertebrae: Vertebral body heights are maintained apart from
degenerative endplate irregularity. Degenerative marrow changes are
present without substantial edema. No suspicious osseous lesion.

Cord: No abnormal signal.

Posterior Fossa, vertebral arteries, paraspinal tissues:
Unremarkable.

Disc levels:

C2-C3:  Mild facet hypertrophy.  No canal or foraminal stenosis.

C3-C4: Minimal central disc protrusion with small endplate
osteophytes. No canal or foraminal stenosis.

C4-C5: Disc bulge with small endplate osteophytes. Left greater than
right uncovertebral and facet hypertrophy. No canal or right
foraminal stenosis. Marked left foraminal stenosis.

C5-C6: Disc bulge with small endplate osteophytes. Left greater than
right uncovertebral and facet hypertrophy. No canal or right
foraminal stenosis. Mild left foraminal stenosis.

C6-C7: Disc bulge with small endplate osteophytes. No canal or
foraminal stenosis.

C7-T1: Left greater than right facet hypertrophy. No canal or right
foraminal stenosis. Mild left foraminal stenosis.
IMPRESSION: Multilevel degenerative changes as detailed above. No high-grade
canal stenosis. Foraminal narrowing is greatest on the left at
C4-C5.

## 2021-01-11 IMAGING — CT CT HEAD W/O CM
1 series · 15 of 30 positions shown, 19 images · non-contrast
Comparison: Cervical spine MRI [DATE]

CLINICAL DATA: Headaches and neck pain.  Fall in [DATE].

EXAM:
CT HEAD WITHOUT CONTRAST
CT CERVICAL SPINE WITHOUT CONTRAST
TECHNIQUE: Multidetector CT imaging of the head and cervical spine was
performed following the standard protocol without intravenous
contrast. Multiplanar CT image reconstructions of the cervical spine
were also generated.

[Series 2: head w/(date) · axial · 0.46mm/px · z∈[-183,-33]mm · 15 of 34 slices shown, 19 images]
[im 2/34  brain]
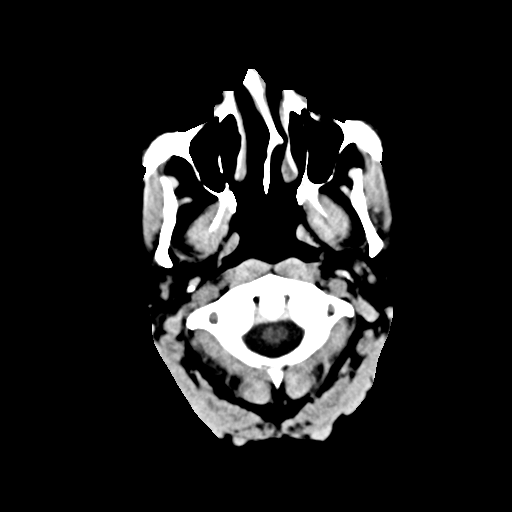
[im 2/34  bone]
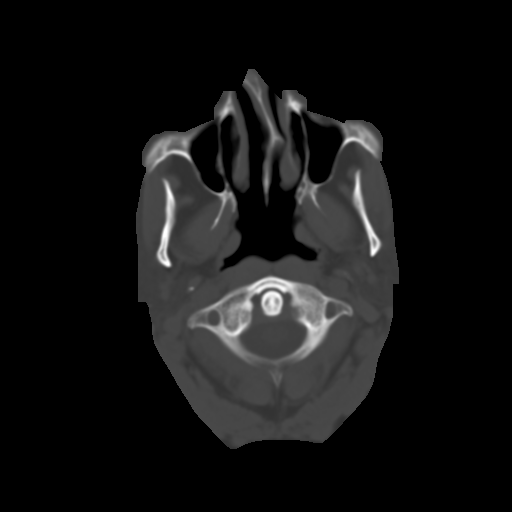
[im 4/34  brain]
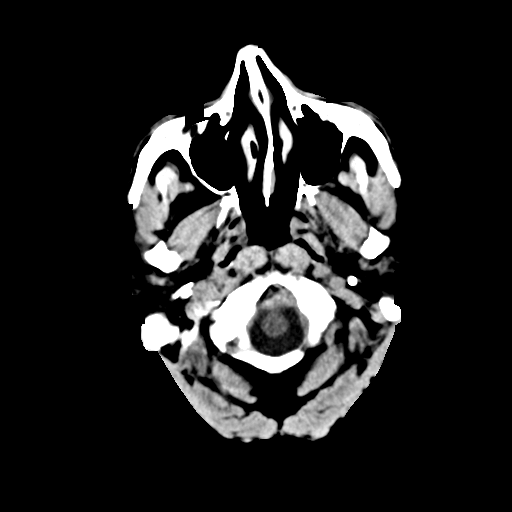
[im 6/34  brain]
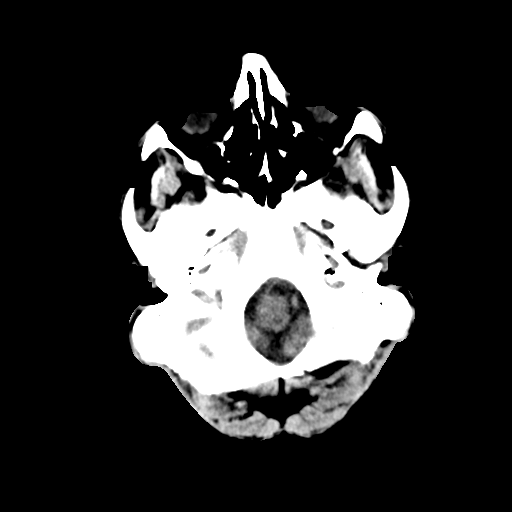
[im 8/34  brain]
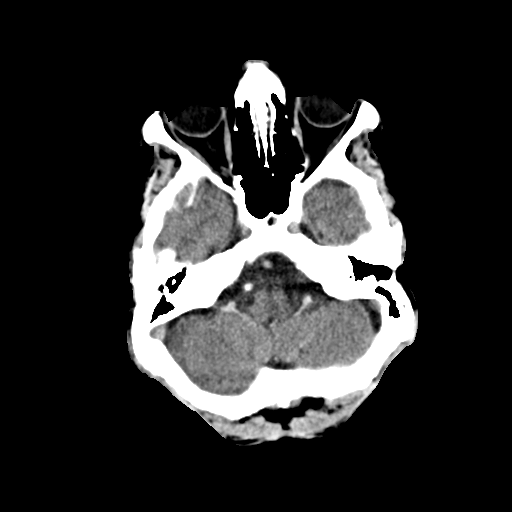
[im 11/34  brain]
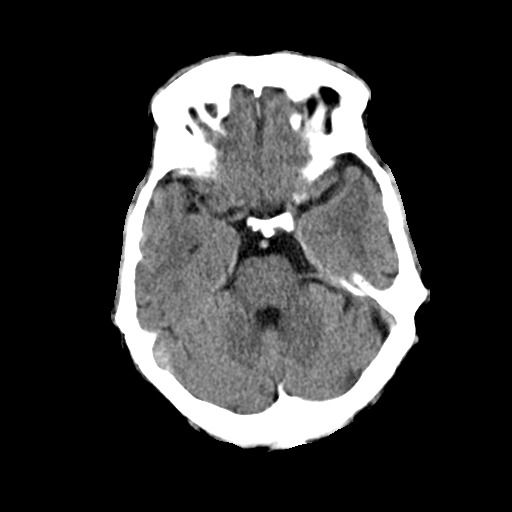
[im 11/34  bone]
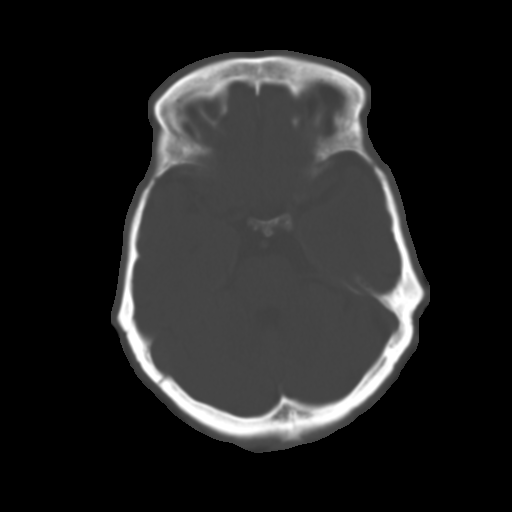
[im 13/34  brain]
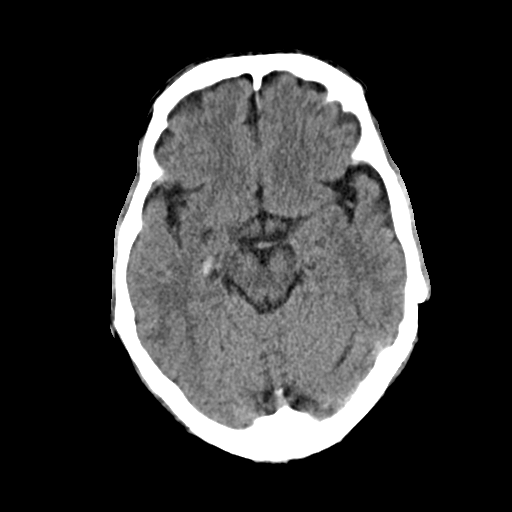
[im 15/34  brain]
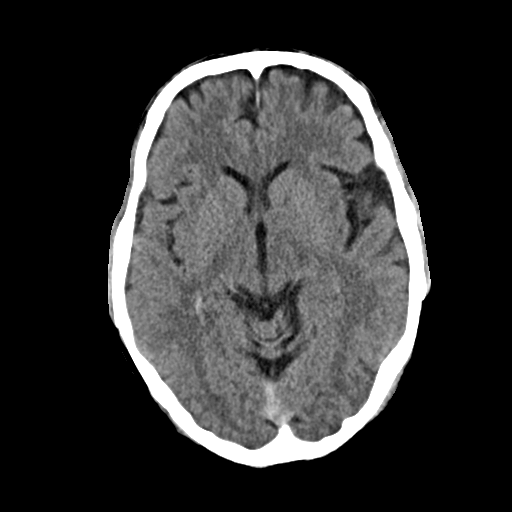
[im 18/34  brain]
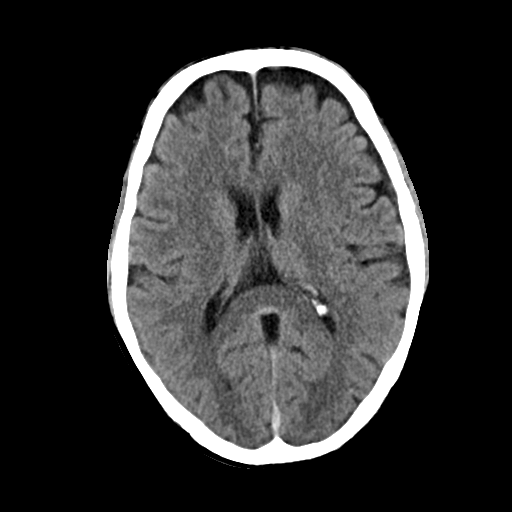
[im 19/34  brain]
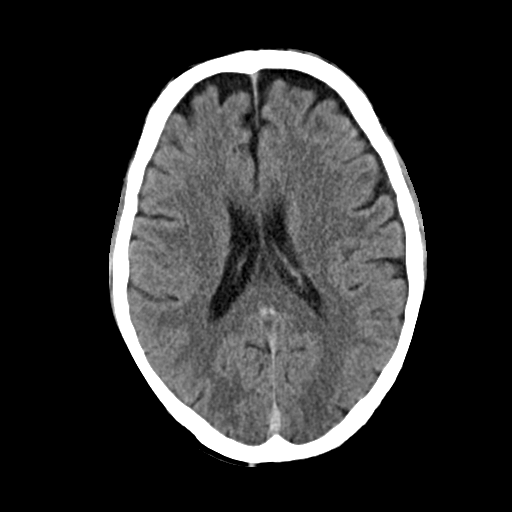
[im 19/34  bone]
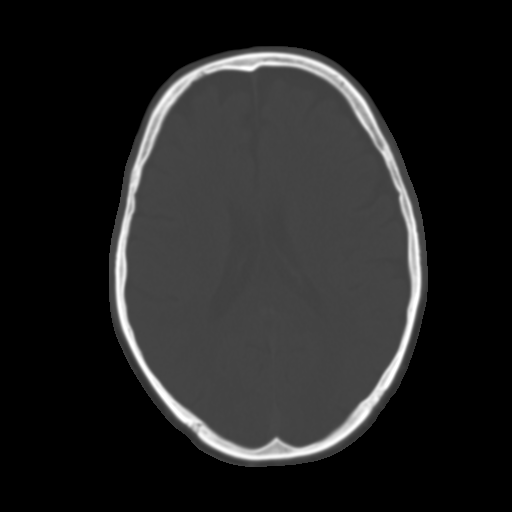
[im 21/34  brain]
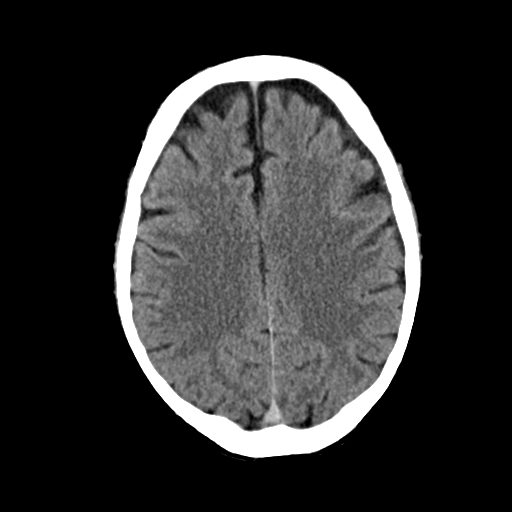
[im 23/34  brain]
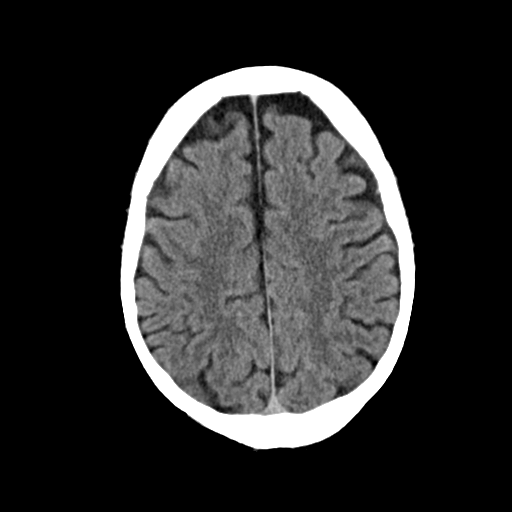
[im 26/34  brain]
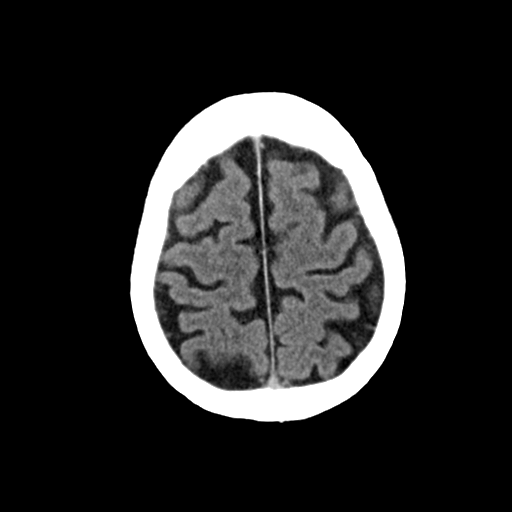
[im 28/34  brain]
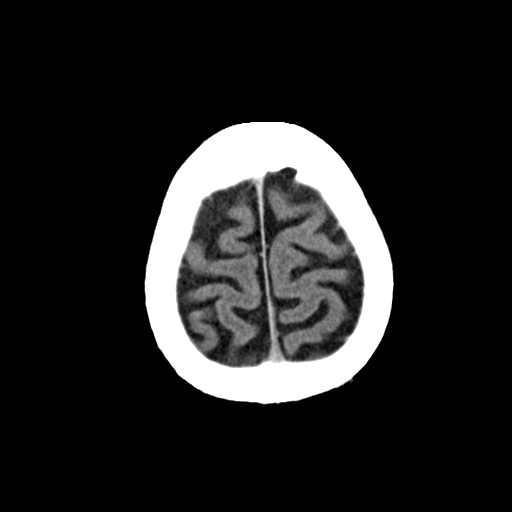
[im 28/34  bone]
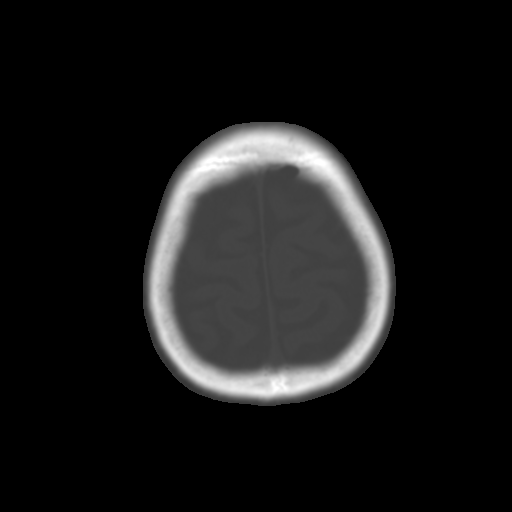
[im 30/34  brain]
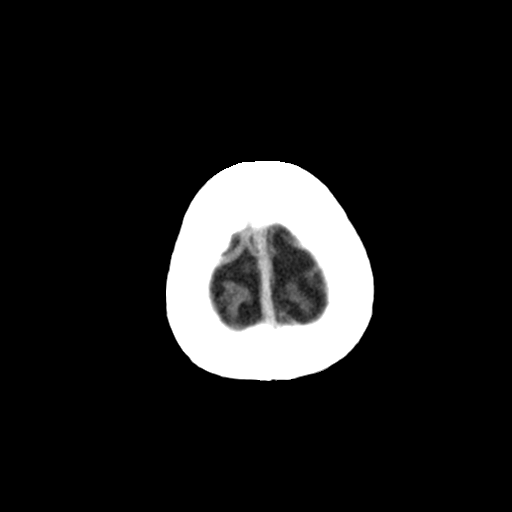
[im 32/34  brain]
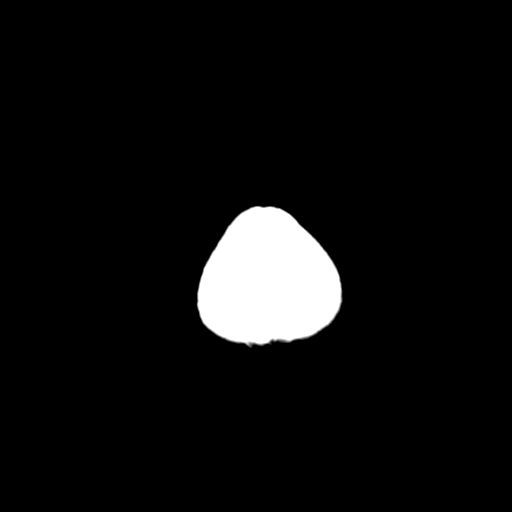

[15 of 30 positions shown; findings below may reference images not displayed]

FINDINGS: CT HEAD FINDINGS

Brain: There is no evidence of an acute infarct, intracranial
hemorrhage, mass, midline shift, or extra-axial fluid collection.
The ventricles and sulci are within normal limits for age.

Vascular: No hyperdense vessel.

Skull: No fracture or suspicious osseous lesion.

Sinuses/Orbits: Visualized paranasal sinuses and mastoid air cells
are clear. Unremarkable orbits.

Other: None.

CT CERVICAL SPINE FINDINGS

Alignment: Slight reversal of the normal cervical lordosis. Trace
anterolisthesis of C7 on T1. Trace retrolisthesis of C4 on C5 and C5
on C6.

Skull base and vertebrae: No evidence of an acute or subacute
fracture or suspicious osseous lesion.

Soft tissues and spinal canal: No prevertebral fluid or swelling. No
visible canal hematoma.

Disc levels: Moderate disc space narrowing and prominent anterior
endplate osteophytosis at C4-5 and C5-6 with milder changes at C3-4
and C6-7. Detailed level by level assessment of degenerative changes
and associated stenosis is deferred to today's MRI.

Upper chest: No apical lung consolidation or mass.

Other: None.
IMPRESSION: 1. Negative head CT.
2. No cervical spine fracture identified.
3. Cervical disc degeneration, more fully evaluated on today's MRI.

## 2021-01-11 IMAGING — CT CT CERVICAL SPINE W/O CM
2 series · 10 of 14 positions shown, 12 images · non-contrast
Comparison: Cervical spine MRI [DATE]

CLINICAL DATA: Headaches and neck pain.  Fall in [DATE].

EXAM:
CT HEAD WITHOUT CONTRAST
CT CERVICAL SPINE WITHOUT CONTRAST
TECHNIQUE: Multidetector CT imaging of the head and cervical spine was
performed following the standard protocol without intravenous
contrast. Multiplanar CT image reconstructions of the cervical spine
were also generated.

[Series 3: cspine soft · axial · 0.31mm/px · z∈[-310,-174]mm · 5 of 102 slices shown, 7 images]
[im 17/102  soft-tissue]
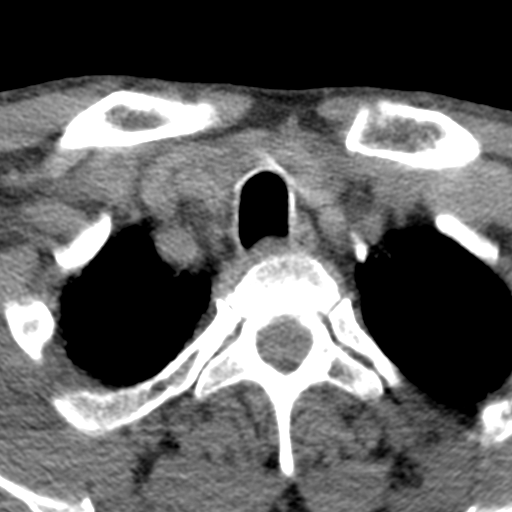
[im 17/102  bone]
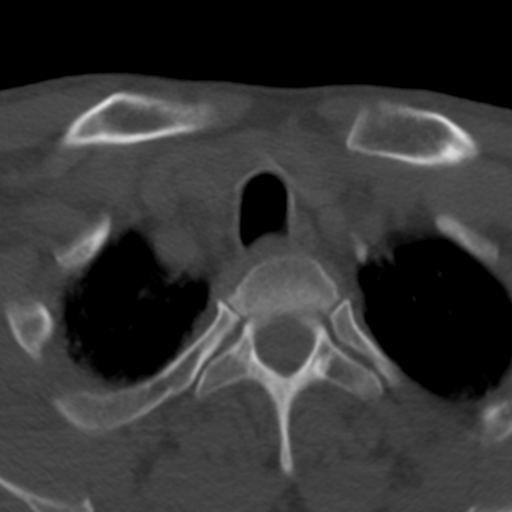
[im 34/102  bone]
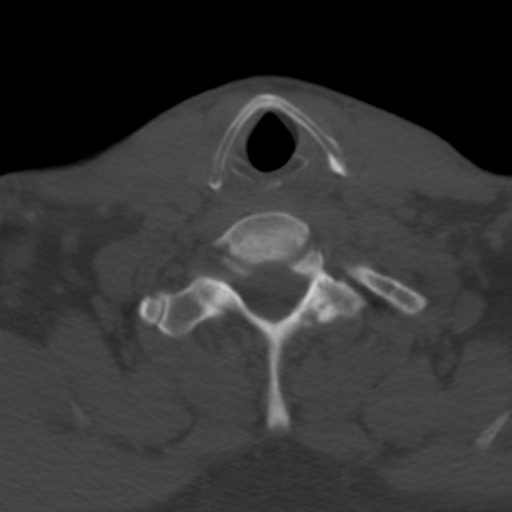
[im 51/102  bone]
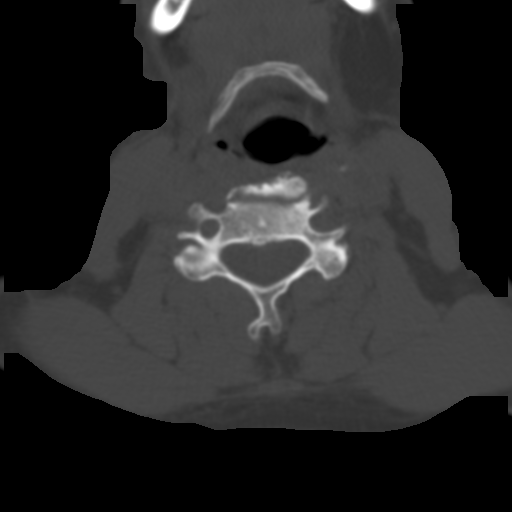
[im 68/102  bone]
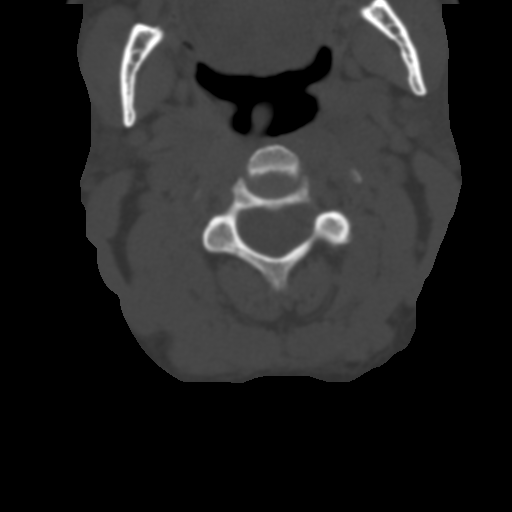
[im 85/102  soft-tissue]
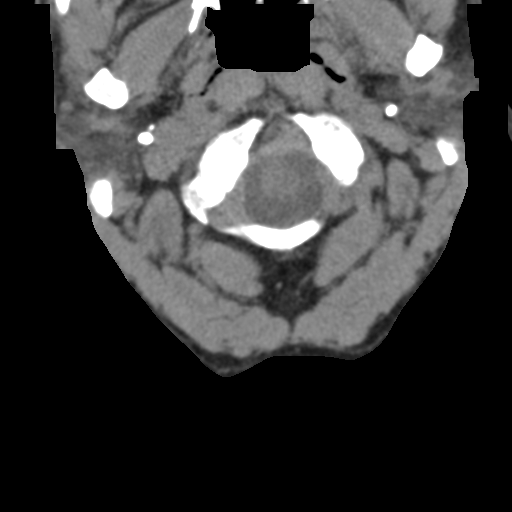
[im 85/102  bone]
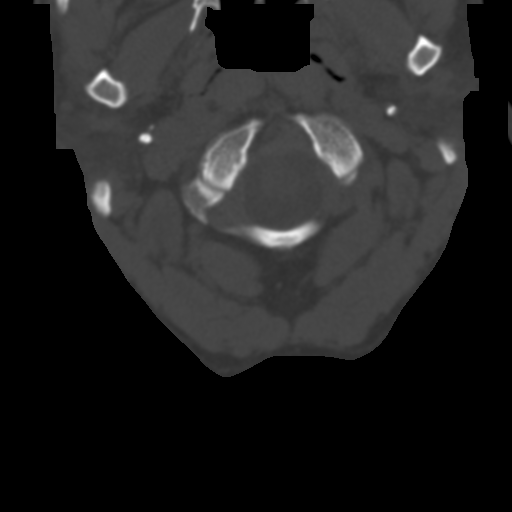

[Series 9: angled axial soft · axial · 0.29mm/px · z∈[-312,-188]mm · 5 of 96 slices shown]
[im 16/96  soft-tissue]
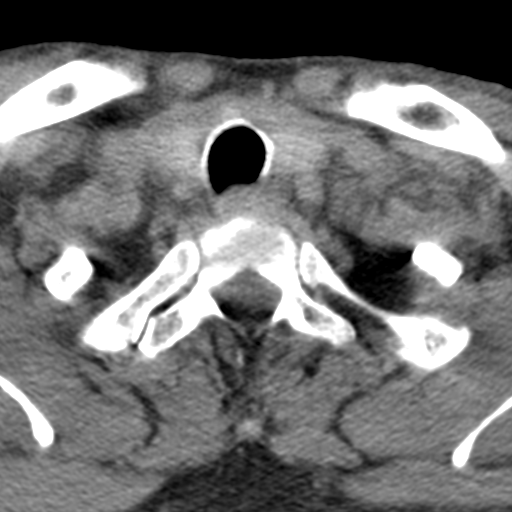
[im 32/96  soft-tissue]
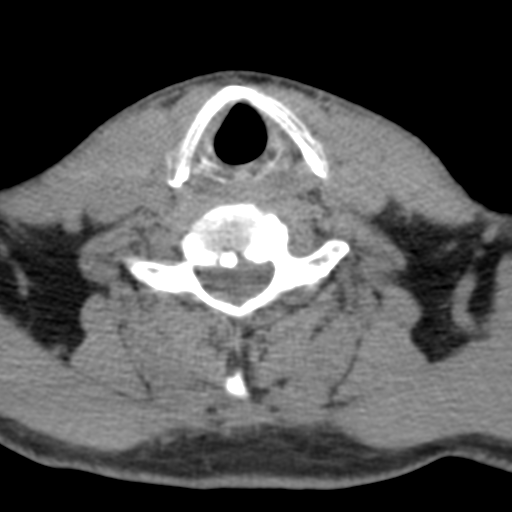
[im 48/96  soft-tissue]
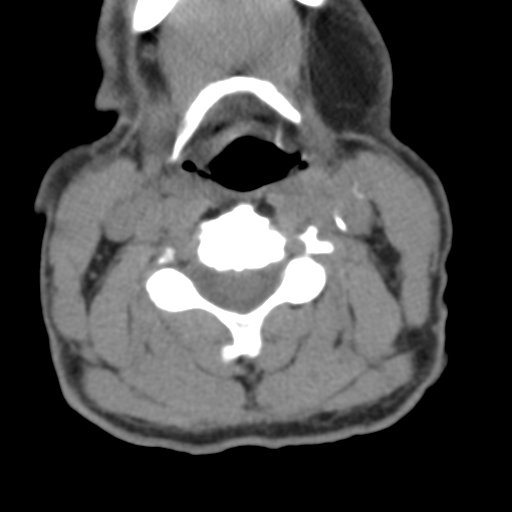
[im 64/96  soft-tissue]
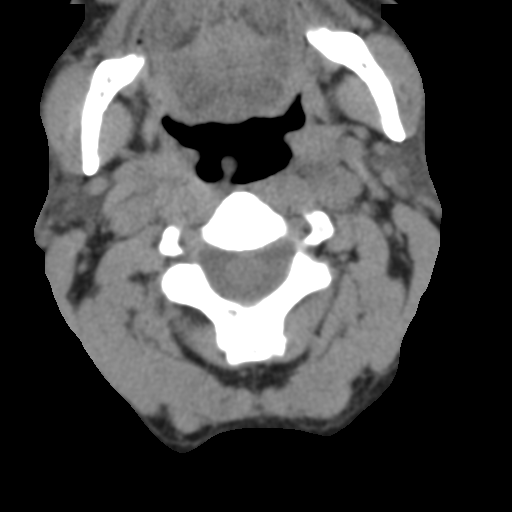
[im 80/96  soft-tissue]
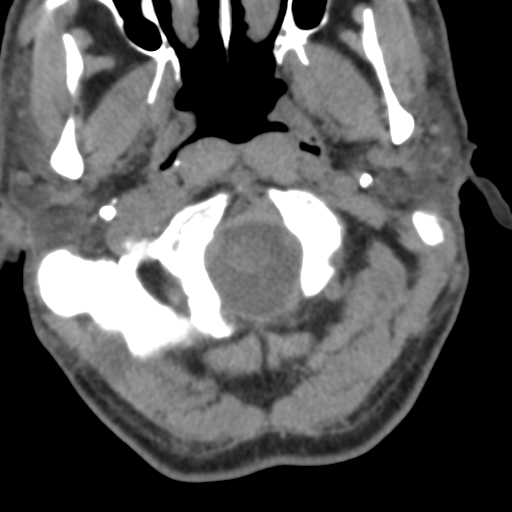

[10 of 14 positions shown; findings below may reference images not displayed]

FINDINGS: CT HEAD FINDINGS

Brain: There is no evidence of an acute infarct, intracranial
hemorrhage, mass, midline shift, or extra-axial fluid collection.
The ventricles and sulci are within normal limits for age.

Vascular: No hyperdense vessel.

Skull: No fracture or suspicious osseous lesion.

Sinuses/Orbits: Visualized paranasal sinuses and mastoid air cells
are clear. Unremarkable orbits.

Other: None.

CT CERVICAL SPINE FINDINGS

Alignment: Slight reversal of the normal cervical lordosis. Trace
anterolisthesis of C7 on T1. Trace retrolisthesis of C4 on C5 and C5
on C6.

Skull base and vertebrae: No evidence of an acute or subacute
fracture or suspicious osseous lesion.

Soft tissues and spinal canal: No prevertebral fluid or swelling. No
visible canal hematoma.

Disc levels: Moderate disc space narrowing and prominent anterior
endplate osteophytosis at C4-5 and C5-6 with milder changes at C3-4
and C6-7. Detailed level by level assessment of degenerative changes
and associated stenosis is deferred to today's MRI.

Upper chest: No apical lung consolidation or mass.

Other: None.
IMPRESSION: 1. Negative head CT.
2. No cervical spine fracture identified.
3. Cervical disc degeneration, more fully evaluated on today's MRI.

## 2021-01-11 IMAGING — CR DG CERVICAL SPINE COMPLETE 4+V
5 series · 5 of 5 positions shown · non-contrast
Comparison: Cervical spine CT and MRI [DATE]

CLINICAL DATA: Headaches and neck pain.  Fall in [DATE].

EXAM:
CERVICAL SPINE - COMPLETE 4+ VIEW

[w cervical spine lat]
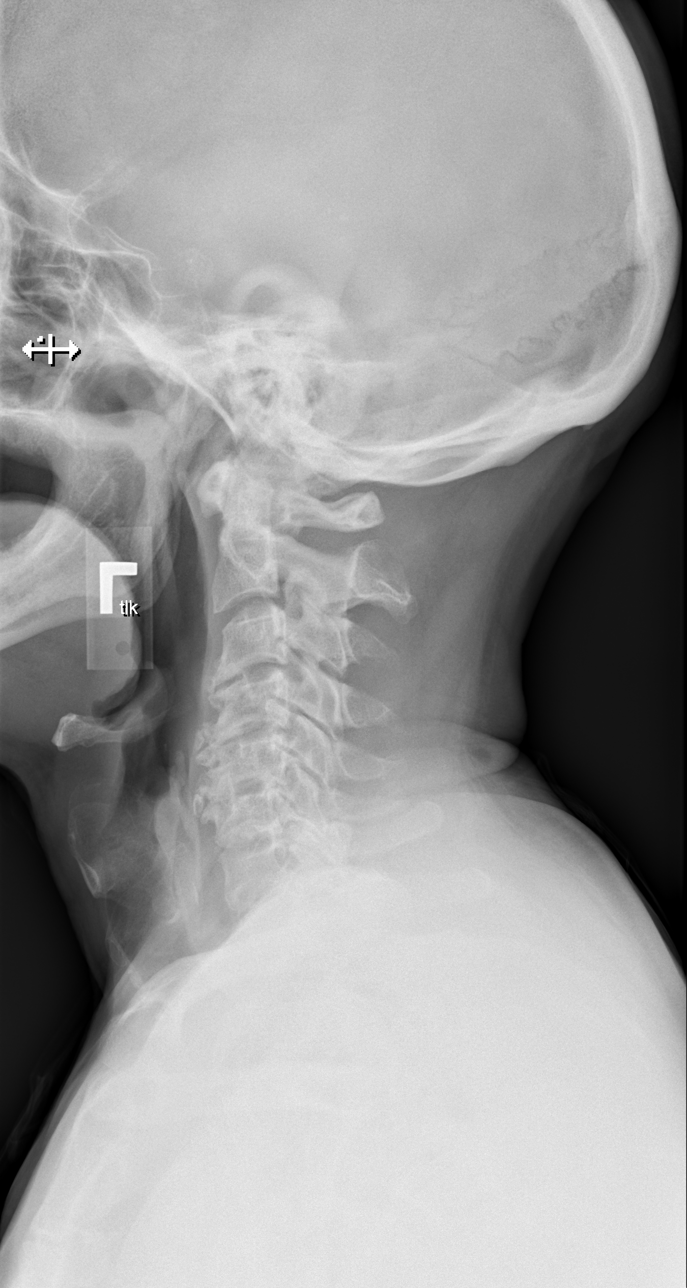

[w cervical spine ap_obl (1 of 2)]
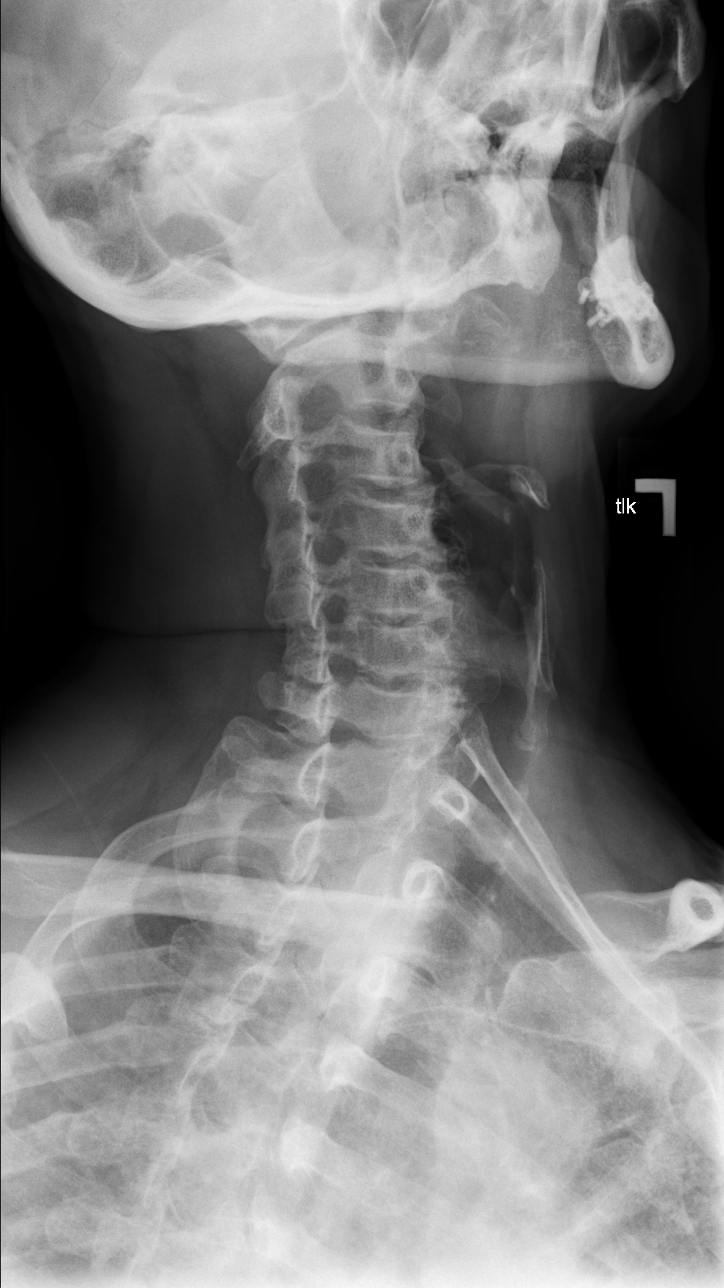

[w cervical spine ap_obl (2 of 2)]
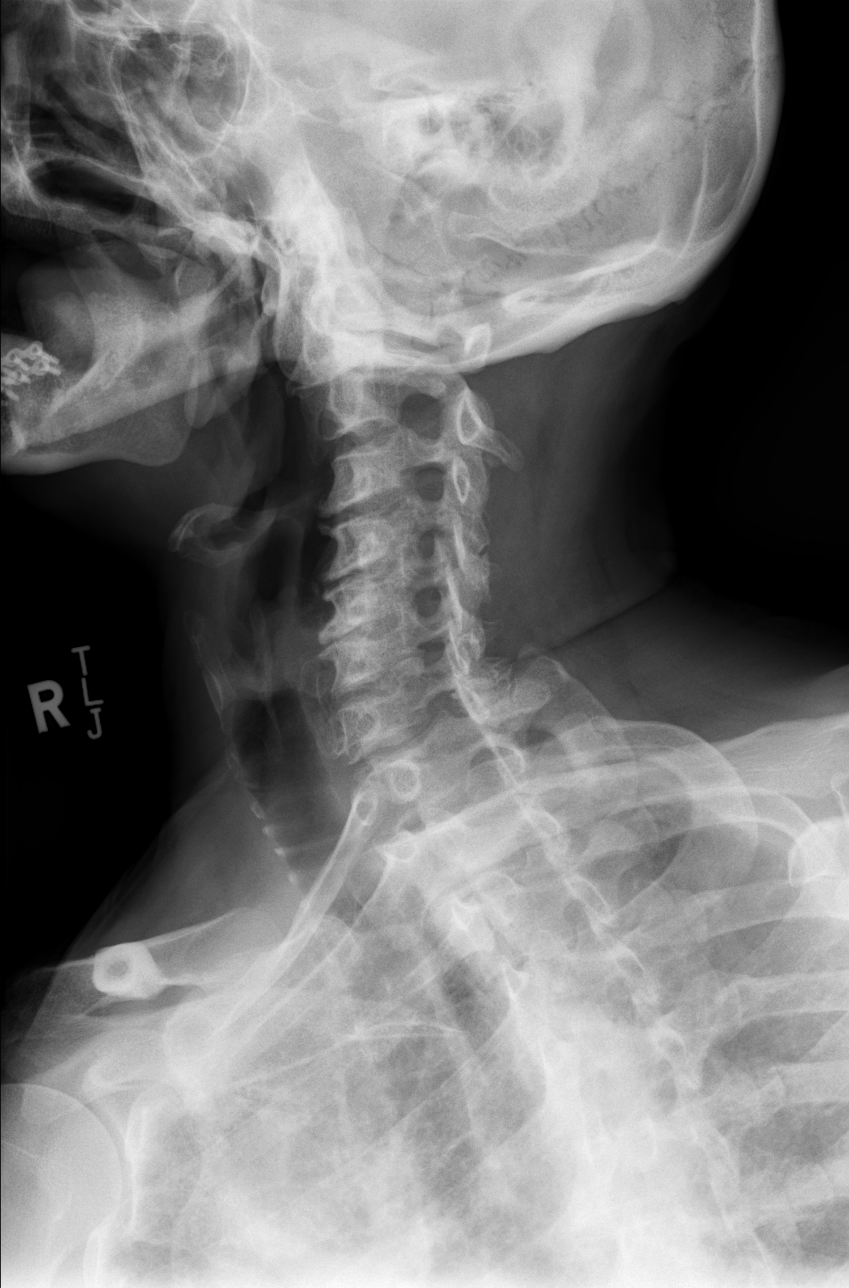

[w cervical spine ap]
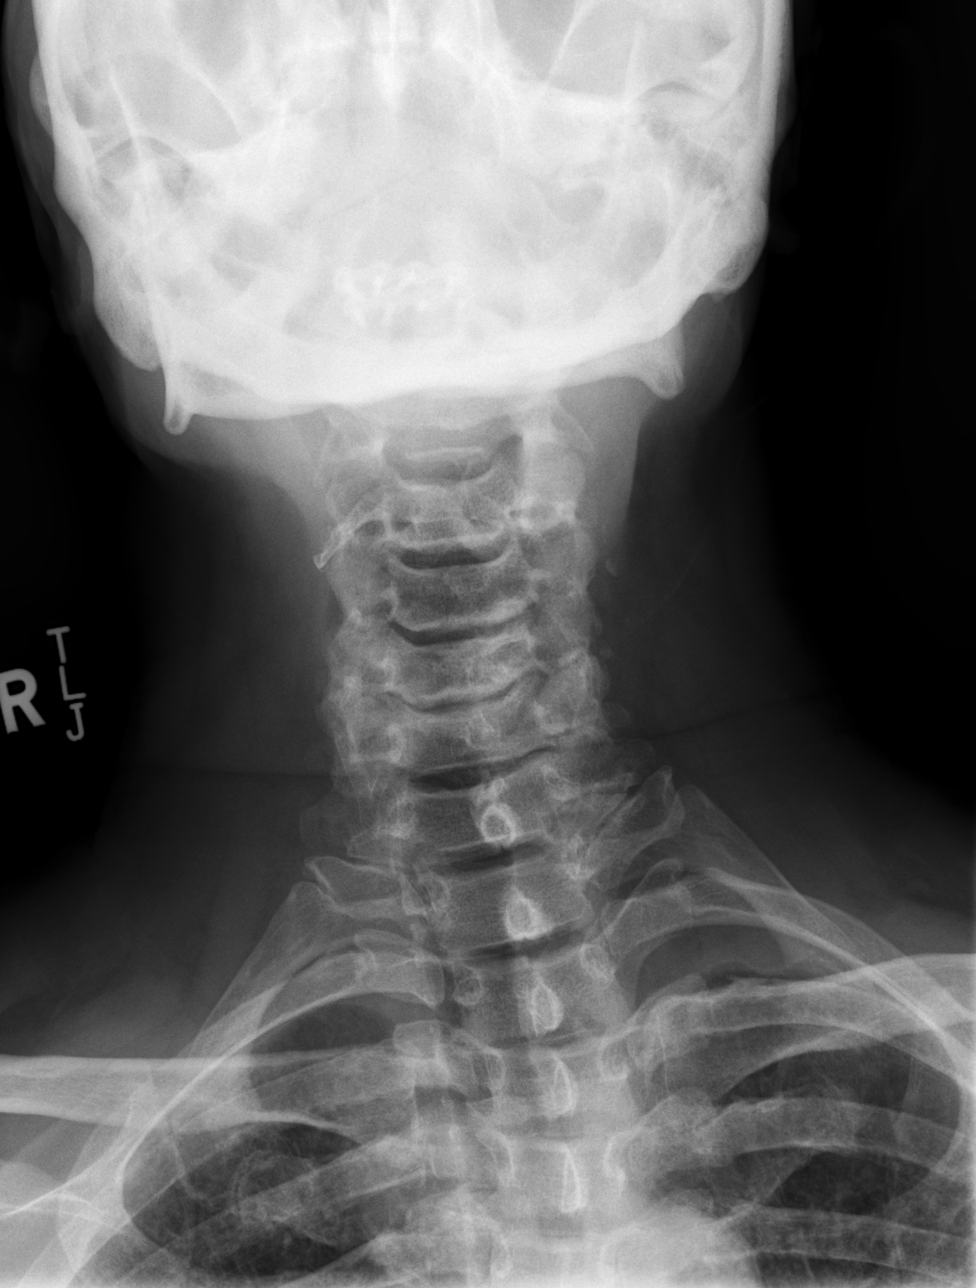

[w cervical spine odontoid]
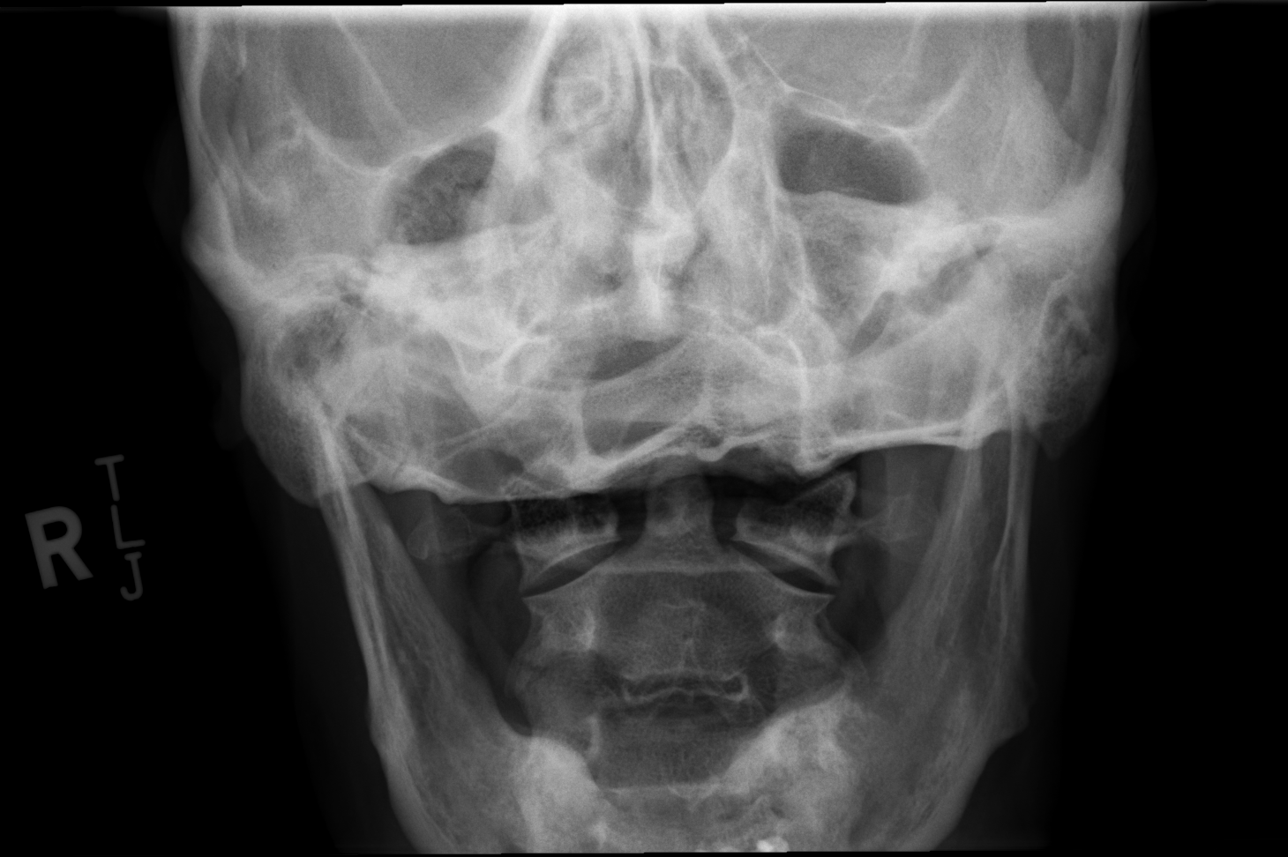

[5 of 5 positions shown; findings below may reference images not displayed]

FINDINGS: There is straightening of the normal cervical lordosis with trace
retrolisthesis of C4 on C5 and C5 on C6. No acute fracture is
identified. Disc space narrowing and endplate spurring are noted
from C3-4 to C5-6, greatest at C4-5 and C5-6. There is evidence of
mild left-sided osseous neural foraminal narrowing at C4-5. The
prevertebral soft tissues are within normal limits.
IMPRESSION: Moderate cervical disc degeneration without evidence of acute
osseous abnormality.
